# Patient Record
Sex: Female | Born: 1935 | Race: Black or African American | Hispanic: No | Smoking: Never smoker
Health system: Southern US, Community
[De-identification: ages and names within clinical notes are randomized; demographics above are authoritative.]

## PROBLEM LIST (undated history)

## (undated) DIAGNOSIS — Z801 Family history of malignant neoplasm of trachea, bronchus and lung: Secondary | ICD-10-CM

## (undated) DIAGNOSIS — C50919 Malignant neoplasm of unspecified site of unspecified female breast: Secondary | ICD-10-CM

## (undated) DIAGNOSIS — Z800 Family history of malignant neoplasm of digestive organs: Secondary | ICD-10-CM

## (undated) DIAGNOSIS — R499 Unspecified voice and resonance disorder: Secondary | ICD-10-CM

## (undated) DIAGNOSIS — I10 Essential (primary) hypertension: Secondary | ICD-10-CM

## (undated) DIAGNOSIS — I639 Cerebral infarction, unspecified: Secondary | ICD-10-CM

## (undated) DIAGNOSIS — C801 Malignant (primary) neoplasm, unspecified: Secondary | ICD-10-CM

## (undated) DIAGNOSIS — I739 Peripheral vascular disease, unspecified: Secondary | ICD-10-CM

## (undated) DIAGNOSIS — D649 Anemia, unspecified: Secondary | ICD-10-CM

## (undated) DIAGNOSIS — I341 Nonrheumatic mitral (valve) prolapse: Secondary | ICD-10-CM

## (undated) HISTORY — DX: Malignant (primary) neoplasm, unspecified: C80.1

## (undated) HISTORY — PX: MASTECTOMY: SHX3

## (undated) HISTORY — DX: Unspecified voice and resonance disorder: R49.9

## (undated) HISTORY — DX: Essential (primary) hypertension: I10

## (undated) HISTORY — DX: Anemia, unspecified: D64.9

## (undated) HISTORY — DX: Family history of malignant neoplasm of trachea, bronchus and lung: Z80.1

## (undated) HISTORY — PX: TONSILLECTOMY: SUR1361

## (undated) HISTORY — PX: ABDOMINAL HYSTERECTOMY: SHX81

## (undated) HISTORY — PX: EYE SURGERY: SHX253

## (undated) HISTORY — DX: Cerebral infarction, unspecified: I63.9

## (undated) HISTORY — PX: WRIST SURGERY: SHX841

## (undated) HISTORY — DX: Malignant neoplasm of unspecified site of unspecified female breast: C50.919

## (undated) HISTORY — DX: Family history of malignant neoplasm of digestive organs: Z80.0

---

## 1980-01-03 HISTORY — PX: LAPAROSCOPIC HYSTERECTOMY: SHX1926

## 1990-01-29 HISTORY — PX: BREAST SURGERY: SHX581

## 1997-09-11 ENCOUNTER — Ambulatory Visit (HOSPITAL_COMMUNITY): Admission: RE | Admit: 1997-09-11 | Discharge: 1997-09-11 | Payer: Self-pay | Admitting: Adolescent Medicine

## 1998-03-31 ENCOUNTER — Ambulatory Visit (HOSPITAL_COMMUNITY): Admission: RE | Admit: 1998-03-31 | Discharge: 1998-03-31 | Payer: Self-pay | Admitting: General Surgery

## 1998-03-31 ENCOUNTER — Encounter (HOSPITAL_BASED_OUTPATIENT_CLINIC_OR_DEPARTMENT_OTHER): Payer: Self-pay | Admitting: General Surgery

## 2000-03-28 ENCOUNTER — Ambulatory Visit (HOSPITAL_COMMUNITY): Admission: RE | Admit: 2000-03-28 | Discharge: 2000-03-28 | Payer: Self-pay | Admitting: Oncology

## 2000-03-28 ENCOUNTER — Encounter (HOSPITAL_COMMUNITY): Payer: Self-pay | Admitting: Oncology

## 2004-05-17 ENCOUNTER — Emergency Department: Payer: Self-pay | Admitting: Emergency Medicine

## 2004-11-29 ENCOUNTER — Ambulatory Visit: Payer: Self-pay

## 2004-12-20 ENCOUNTER — Ambulatory Visit: Payer: Self-pay | Admitting: Oncology

## 2004-12-29 ENCOUNTER — Ambulatory Visit (HOSPITAL_COMMUNITY): Admission: RE | Admit: 2004-12-29 | Discharge: 2004-12-29 | Payer: Self-pay | Admitting: Oncology

## 2004-12-29 ENCOUNTER — Encounter: Admission: RE | Admit: 2004-12-29 | Discharge: 2004-12-29 | Payer: Self-pay | Admitting: Oncology

## 2005-02-06 ENCOUNTER — Ambulatory Visit: Payer: Self-pay | Admitting: Oncology

## 2005-02-28 ENCOUNTER — Encounter: Admission: RE | Admit: 2005-02-28 | Discharge: 2005-02-28 | Payer: Self-pay | Admitting: General Surgery

## 2005-03-02 ENCOUNTER — Ambulatory Visit (HOSPITAL_BASED_OUTPATIENT_CLINIC_OR_DEPARTMENT_OTHER): Admission: RE | Admit: 2005-03-02 | Discharge: 2005-03-02 | Payer: Self-pay | Admitting: General Surgery

## 2005-04-12 ENCOUNTER — Ambulatory Visit: Payer: Self-pay | Admitting: Oncology

## 2005-05-03 ENCOUNTER — Encounter: Payer: Self-pay | Admitting: *Deleted

## 2005-06-10 ENCOUNTER — Emergency Department: Payer: Self-pay | Admitting: Unknown Physician Specialty

## 2005-06-26 ENCOUNTER — Ambulatory Visit: Payer: Self-pay | Admitting: Internal Medicine

## 2005-06-30 ENCOUNTER — Ambulatory Visit: Payer: Self-pay | Admitting: Internal Medicine

## 2005-07-28 ENCOUNTER — Encounter: Admission: RE | Admit: 2005-07-28 | Discharge: 2005-07-28 | Payer: Self-pay | Admitting: General Surgery

## 2005-08-07 ENCOUNTER — Encounter: Admission: RE | Admit: 2005-08-07 | Discharge: 2005-08-07 | Payer: Self-pay | Admitting: General Surgery

## 2005-08-14 ENCOUNTER — Encounter: Admission: RE | Admit: 2005-08-14 | Discharge: 2005-08-14 | Payer: Self-pay | Admitting: General Surgery

## 2005-08-21 ENCOUNTER — Encounter: Admission: RE | Admit: 2005-08-21 | Discharge: 2005-08-21 | Payer: Self-pay | Admitting: General Surgery

## 2005-09-12 ENCOUNTER — Ambulatory Visit (HOSPITAL_BASED_OUTPATIENT_CLINIC_OR_DEPARTMENT_OTHER): Admission: RE | Admit: 2005-09-12 | Discharge: 2005-09-12 | Payer: Self-pay | Admitting: General Surgery

## 2005-09-12 HISTORY — PX: BREAST SURGERY: SHX581

## 2006-09-26 ENCOUNTER — Ambulatory Visit: Payer: Self-pay | Admitting: General Surgery

## 2007-03-20 ENCOUNTER — Emergency Department: Payer: Self-pay | Admitting: Emergency Medicine

## 2007-07-12 ENCOUNTER — Ambulatory Visit: Payer: Self-pay | Admitting: Internal Medicine

## 2007-08-26 ENCOUNTER — Ambulatory Visit: Payer: Self-pay | Admitting: Internal Medicine

## 2007-11-10 ENCOUNTER — Emergency Department: Payer: Self-pay | Admitting: Emergency Medicine

## 2008-01-03 HISTORY — PX: COLONOSCOPY: SHX174

## 2008-06-26 ENCOUNTER — Emergency Department: Payer: Self-pay | Admitting: Emergency Medicine

## 2008-10-13 ENCOUNTER — Ambulatory Visit: Payer: Self-pay | Admitting: Internal Medicine

## 2008-10-15 ENCOUNTER — Ambulatory Visit: Payer: Self-pay | Admitting: Internal Medicine

## 2009-04-15 DIAGNOSIS — I639 Cerebral infarction, unspecified: Secondary | ICD-10-CM

## 2009-04-15 HISTORY — DX: Cerebral infarction, unspecified: I63.9

## 2009-04-16 ENCOUNTER — Inpatient Hospital Stay: Payer: Self-pay | Admitting: Internal Medicine

## 2010-09-29 ENCOUNTER — Other Ambulatory Visit (INDEPENDENT_AMBULATORY_CARE_PROVIDER_SITE_OTHER): Payer: Self-pay | Admitting: General Surgery

## 2010-09-29 ENCOUNTER — Encounter (INDEPENDENT_AMBULATORY_CARE_PROVIDER_SITE_OTHER): Payer: Self-pay | Admitting: General Surgery

## 2010-09-29 ENCOUNTER — Ambulatory Visit (INDEPENDENT_AMBULATORY_CARE_PROVIDER_SITE_OTHER): Payer: PRIVATE HEALTH INSURANCE | Admitting: General Surgery

## 2010-09-29 ENCOUNTER — Ambulatory Visit
Admission: RE | Admit: 2010-09-29 | Discharge: 2010-09-29 | Disposition: A | Discharge status: Home / Self Care | Payer: 59 | Source: Ambulatory Visit | Attending: General Surgery | Admitting: General Surgery

## 2010-09-29 VITALS — BP 162/94 | HR 62 | Temp 97.0°F | Resp 14 | Ht 68.5 in | Wt 172.8 lb

## 2010-09-29 DIAGNOSIS — Z853 Personal history of malignant neoplasm of breast: Secondary | ICD-10-CM

## 2010-09-29 DIAGNOSIS — R0789 Other chest pain: Secondary | ICD-10-CM

## 2010-09-29 DIAGNOSIS — R071 Chest pain on breathing: Secondary | ICD-10-CM

## 2010-09-29 NOTE — Progress Notes (Signed)
Chief Complaint  Patient presents with  . Pain    Eval left breast pain     HPI Rebekah Lowery is a 77 y.o. female.   This patient was referred to me by Dr. Dario Guardian for evaluation of left chest wall pain and for a breast checkup.  The patient has a significant history of a left mastectomy by Dr. Leola Brazil in 1992 for  metachronous breast carcinoma. She also has a history of a right partial mastectomy by Dr. Francina Ames in 2007 for ductal carcinoma in situ, 5 mm, subareolar.. She has a remote history of a right breast biopsy for benign disease. Last mammogram was at [facility] in 2008.  She states that she had a stroke in April 2011 and has a little bit of memory problems and otherwise is doing well. She has diet-controlled diabetes. She has hypertension.  She recently saw her primary care physician and had some irritation under her right arm and was placed on antibiotics. She feels that she has a tiny cyst under the right arm. She has no complaints about her right breast. She has no complains about her left mastectomy wound but she does note some intermittent pain under the incision. It is not hurting female. She denies trauma.  She has not had any recent imaging studies. She apparently does not get regular mammograms.   HPI  Past Medical History  Diagnosis Date  . Anemia   . Stroke 04/15/09  . Diabetes mellitus   . Hypertension   . Change in voice   . Cancer     left breast  . Family history of pancreatic cancer   . Family history of lung cancer     Past Surgical History  Procedure Date  . Breast surgery 01/29/1990    mastectomy of left  . Breast surgery 09/12/2005    pasrtial mastectomy of right    Family History  Problem Relation Age of Onset  . Cancer Sister     breast  . Cancer Maternal Aunt     breast    Social History History  Substance Use Topics  . Smoking status: Never Smoker   . Smokeless tobacco: Not on file  . Alcohol Use: No     Allergies  Allergen Reactions  . Aspirin Swelling and Other (See Comments)    Swelling of lips, caused pain also.  . Citric Acid Swelling and Rash    Swelling all over the body.  . Codeine Itching, Swelling and Rash    Swelling was all over the body.  Luster Landsberg Juice Swelling and Rash    Allergic to lemons in general.  . Monosodium Glutamate Other (See Comments)    Causes spike in bp, nausea, constipation, gi upset.  . Peanut-Containing Drug Products Nausea Only    Current Outpatient Prescriptions  Medication Sig Dispense Refill  . Propranolol HCl (INDERAL PO) Take by mouth 2 (two) times daily. Patient takes the generic version of this medication. Unaware of the name and dosage at this time. Ask patient to provide information at next appt.         Review of Systems Review of Systems  Constitutional: Negative.   Eyes: Negative.   Respiratory: Negative.   Cardiovascular: Negative.   Gastrointestinal: Negative.   Genitourinary: Negative.   Musculoskeletal: Negative.   Neurological: Negative.        Memory problems. No motor or sensory deficits by history.  Hematological: Negative.   Psychiatric/Behavioral: Negative.  Blood pressure 162/94, pulse 62, temperature 97 F (36.1 C), temperature source Temporal, resp. rate 14, height 5' 8.5" (1.74 m), weight 172 lb 12.8 oz (78.382 kg).  Physical Exam Physical Exam  Constitutional: She is oriented to person, place, and time. She appears well-developed and well-nourished. No distress.  HENT:  Head: Normocephalic and atraumatic.  Nose: Nose normal.  Mouth/Throat: No oropharyngeal exudate.  Eyes: Conjunctivae are normal. Pupils are equal, round, and reactive to light. Right eye exhibits no discharge. Left eye exhibits no discharge. No scleral icterus.  Neck: Normal range of motion. Neck supple. No JVD present. No tracheal deviation present. No thyromegaly present.  Cardiovascular: Normal rate, regular rhythm, normal heart  sounds and intact distal pulses.  Exam reveals no gallop.   No murmur heard. Pulmonary/Chest: Effort normal and breath sounds normal. No respiratory distress. She has no wheezes. She has no rales. She exhibits no tenderness.    Abdominal: Bowel sounds are normal. She exhibits no distension and no mass. There is no tenderness. There is no rebound and no guarding.  Musculoskeletal: Normal range of motion. She exhibits no edema and no tenderness.  Lymphadenopathy:    She has no cervical adenopathy.  Neurological: She is alert and oriented to person, place, and time. She exhibits normal muscle tone. Coordination normal.  Skin: Skin is warm and dry. No rash noted. She is not diaphoretic. No erythema. No pallor.  Psychiatric: She has a normal mood and affect. Her behavior is normal. Judgment and thought content normal.    Data Reviewed I have reviewed all of her records in the old chart. There is no new data or information.  Assessment    Intermittent left chest wall pain. The mild intermittent nature of this suggests benign etiology such as costochondritis.  Status post left modified radical mastectomy 20 years ago for breast cancer. No evidence of local recurrence by physical exam.  Status post right partial mastectomy for ductal carcinoma in situ, 2007, no evidence of recurrent cancer by physical exam.  History of cerebrovascular accident April 2011.  Hypertension  Borderline diabetes.      Plan    We will obtain a diagnostic right breast mammogram, chest x-ray, and left rib detail.  She'll return to see me in one month for followup visit.  She was advised to get annual mammography and annual breast exam.       [name] M 09/29/2010, 9:58 AM

## 2010-09-29 NOTE — Patient Instructions (Signed)
Your physical exam today is normal. There is no evidence of cancer. You will be scheduled for a right breast mammogram, chest x-ray, and left rib x-rays to evaluate the left anterior chest wall pain. You will return to see me in one month.

## 2010-10-10 ENCOUNTER — Ambulatory Visit
Admission: RE | Admit: 2010-10-10 | Discharge: 2010-10-10 | Disposition: A | Discharge status: Home / Self Care | Payer: 59 | Source: Ambulatory Visit | Attending: Surgery | Admitting: Surgery

## 2010-10-10 DIAGNOSIS — Z853 Personal history of malignant neoplasm of breast: Secondary | ICD-10-CM

## 2010-10-31 ENCOUNTER — Encounter (INDEPENDENT_AMBULATORY_CARE_PROVIDER_SITE_OTHER): Payer: Self-pay | Admitting: General Surgery

## 2010-10-31 ENCOUNTER — Ambulatory Visit (INDEPENDENT_AMBULATORY_CARE_PROVIDER_SITE_OTHER): Payer: PRIVATE HEALTH INSURANCE | Admitting: General Surgery

## 2010-10-31 VITALS — BP 160/78 | HR 68 | Temp 97.2°F | Resp 18 | Wt 174.0 lb

## 2010-10-31 DIAGNOSIS — Z853 Personal history of malignant neoplasm of breast: Secondary | ICD-10-CM

## 2010-10-31 NOTE — Patient Instructions (Signed)
Your mammograms of the right breast are normal. Your chest x-ray is normal. The left sided rib pain has resolved. I do not find any evidence of recurrent breast cancer. Your advised to get a breast exam and a mammogram once a year. This will be performed by your primary care physician. Return to see me if any new problems arise.

## 2010-10-31 NOTE — Progress Notes (Signed)
Subjective:     Patient ID: Rebekah Lowery, female   DOB: November 09, 1933, 77 y.o.   MRN: [id number]  HPI This 77 year old African American female returns for followup evaluation of her left chest wall pain and history of breast cancer.  Her chest x-ray is normal, and the mammograms of her right breast are normal. She states that the left rib pain in the anterior inferior costal margin has resolved.  Review of Systems  Constitutional: Negative for fever, chills and unexpected weight change.  HENT: Negative for hearing loss, congestion, sore throat, trouble swallowing and voice change.   Eyes: Negative for visual disturbance.  Respiratory: Negative for cough and wheezing.   Cardiovascular: Negative for chest pain, palpitations and leg swelling.  Gastrointestinal: Negative for nausea, vomiting, abdominal pain, diarrhea, constipation, blood in stool, abdominal distention and anal bleeding.  Genitourinary: Negative for hematuria, vaginal bleeding and difficulty urinating.  Musculoskeletal: Negative for arthralgias.  Skin: Negative for rash and wound.  Neurological: Negative for seizures, syncope and headaches.  Hematological: Negative for adenopathy. Does not bruise/bleed easily.  Psychiatric/Behavioral: Negative for confusion.       Objective:   Physical Exam  Neck: Normal range of motion. Neck supple. No JVD present. No tracheal deviation present. No thyromegaly present.  Cardiovascular: Normal rate, regular rhythm and normal heart sounds.   Pulmonary/Chest: Effort normal and breath sounds normal. No respiratory distress. She has no wheezes. She has no rales. She exhibits no tenderness.       No rib or costal cartiledge tenderness. Right breast scars healed, without mass or adenopathy. Left mastectomy scar soft, no nodules, ulceration or adenopathy.  Lymphadenopathy:    She has no cervical adenopathy.  Skin: Skin is warm and dry. No rash noted. No erythema. No pallor.  Psychiatric: She has  a normal mood and affect. Her behavior is normal. Judgment and thought content normal.       Assessment:     Left chest wall pain, costal cartilage area.   Since this has resolved and the chest x-ray and mammograms are normal, I think that this is a benign self-limited process such as costochondritis.  History of left modified radical mastectomy, 1992 for stage II breast cancer, followed by adjuvant chemotherapy and endocrine therapy. No known recurrence to date.  History right partial mastectomy 2007 for ductal carcinoma in situ. No additional therapy was offered. No evidence of recurrence.  History CVA in 2011.  Anemia  Diabetes mellitus  Hypertension.     Plan:     The patient was advised of her imaging findings and my impressions.  She is advised to get a right breast mammogram annually, and to also get a breast exam by her primary care physician annually.  Return to see me.

## 2010-12-29 ENCOUNTER — Encounter (INDEPENDENT_AMBULATORY_CARE_PROVIDER_SITE_OTHER): Payer: PRIVATE HEALTH INSURANCE | Admitting: Surgery

## 2011-01-03 HISTORY — PX: BREAST SURGERY: SHX581

## 2011-06-27 ENCOUNTER — Emergency Department: Payer: Self-pay | Admitting: Emergency Medicine

## 2011-10-02 ENCOUNTER — Ambulatory Visit: Payer: Self-pay | Admitting: Obstetrics and Gynecology

## 2011-10-17 ENCOUNTER — Ambulatory Visit: Payer: Self-pay | Admitting: Obstetrics and Gynecology

## 2011-11-02 ENCOUNTER — Ambulatory Visit: Payer: Self-pay | Admitting: General Surgery

## 2011-11-06 ENCOUNTER — Other Ambulatory Visit: Payer: Self-pay | Admitting: General Surgery

## 2011-11-06 LAB — CBC WITH DIFFERENTIAL/PLATELET
Basophil #: 0 10*3/uL (ref 0.0–0.1)
Basophil %: 0.7 %
Eosinophil #: 0.1 10*3/uL (ref 0.0–0.7)
Eosinophil %: 1.7 %
HCT: 36.9 % (ref 35.0–47.0)
HGB: 12 g/dL (ref 12.0–16.0)
Lymphocyte #: 2.2 10*3/uL (ref 1.0–3.6)
Lymphocyte %: 31 %
MCH: 26.1 pg (ref 26.0–34.0)
MCHC: 32.6 g/dL (ref 32.0–36.0)
MCV: 80 fL (ref 80–100)
Monocyte #: 0.5 x10 3/mm (ref 0.2–0.9)
Monocyte %: 6.5 %
Neutrophil #: 4.2 10*3/uL (ref 1.4–6.5)
Neutrophil %: 60.1 %
Platelet: 260 10*3/uL (ref 150–440)
RBC: 4.61 10*6/uL (ref 3.80–5.20)
RDW: 13.7 % (ref 11.5–14.5)
WBC: 7 10*3/uL (ref 3.6–11.0)

## 2011-11-06 LAB — COMPREHENSIVE METABOLIC PANEL
Albumin: 3.5 g/dL (ref 3.4–5.0)
Alkaline Phosphatase: 91 U/L (ref 50–136)
Anion Gap: 4 — ABNORMAL LOW (ref 7–16)
BUN: 14 mg/dL (ref 7–18)
Bilirubin,Total: 0.3 mg/dL (ref 0.2–1.0)
Calcium, Total: 8.7 mg/dL (ref 8.5–10.1)
Chloride: 106 mmol/L (ref 98–107)
Co2: 30 mmol/L (ref 21–32)
Creatinine: 1.04 mg/dL (ref 0.60–1.30)
EGFR (African American): >60
EGFR (Non-African Amer.): 52 — ABNORMAL LOW
Glucose: 142 mg/dL — ABNORMAL HIGH (ref 65–99)
Osmolality: 282 (ref 275–301)
Potassium: 4 mmol/L (ref 3.5–5.1)
SGOT(AST): 21 U/L (ref 15–37)
SGPT (ALT): 23 U/L (ref 12–78)
Sodium: 140 mmol/L (ref 136–145)
Total Protein: 8.2 g/dL (ref 6.4–8.2)

## 2011-11-07 LAB — CANCER ANTIGEN 27.29: CA 27.29: 13.4 U/mL (ref 0.0–38.6)

## 2011-11-07 LAB — CEA: CEA: 6.3 ng/mL — ABNORMAL HIGH (ref 0.0–4.7)

## 2011-11-14 ENCOUNTER — Ambulatory Visit: Payer: Self-pay | Admitting: General Surgery

## 2011-11-17 LAB — PATHOLOGY REPORT

## 2011-11-21 ENCOUNTER — Ambulatory Visit: Payer: Self-pay | Admitting: General Surgery

## 2011-11-24 LAB — PATHOLOGY REPORT

## 2011-12-11 DIAGNOSIS — C50919 Malignant neoplasm of unspecified site of unspecified female breast: Secondary | ICD-10-CM

## 2011-12-11 HISTORY — DX: Malignant neoplasm of unspecified site of unspecified female breast: C50.919

## 2011-12-16 ENCOUNTER — Encounter: Payer: Self-pay | Admitting: *Deleted

## 2012-01-24 ENCOUNTER — Ambulatory Visit: Payer: Self-pay | Admitting: General Surgery

## 2012-02-19 ENCOUNTER — Ambulatory Visit: Payer: Self-pay | Admitting: Oncology

## 2012-03-02 ENCOUNTER — Ambulatory Visit: Payer: Self-pay | Admitting: Oncology

## 2012-05-07 ENCOUNTER — Ambulatory Visit: Payer: Self-pay | Admitting: Ophthalmology

## 2012-05-07 DIAGNOSIS — I10 Essential (primary) hypertension: Secondary | ICD-10-CM

## 2012-05-07 LAB — CBC WITH DIFFERENTIAL/PLATELET
Basophil #: 0.1 10*3/uL (ref 0.0–0.1)
Basophil %: 1.1 %
Eosinophil #: 0.1 10*3/uL (ref 0.0–0.7)
Eosinophil %: 1.8 %
HCT: 33.6 % — ABNORMAL LOW (ref 35.0–47.0)
HGB: 11.1 g/dL — ABNORMAL LOW (ref 12.0–16.0)
Lymphocyte #: 2.1 10*3/uL (ref 1.0–3.6)
Lymphocyte %: 33.3 %
MCH: 25.8 pg — ABNORMAL LOW (ref 26.0–34.0)
MCHC: 33.1 g/dL (ref 32.0–36.0)
MCV: 78 fL — ABNORMAL LOW (ref 80–100)
Monocyte #: 0.5 x10 3/mm (ref 0.2–0.9)
Monocyte %: 7.7 %
Neutrophil #: 3.5 10*3/uL (ref 1.4–6.5)
Neutrophil %: 56.1 %
Platelet: 231 10*3/uL (ref 150–440)
RBC: 4.32 10*6/uL (ref 3.80–5.20)
RDW: 13.6 % (ref 11.5–14.5)
WBC: 6.3 10*3/uL (ref 3.6–11.0)

## 2012-05-07 LAB — POTASSIUM: Potassium: 4.2 mmol/L (ref 3.5–5.1)

## 2012-05-20 ENCOUNTER — Ambulatory Visit: Payer: Self-pay | Admitting: Ophthalmology

## 2012-06-03 ENCOUNTER — Ambulatory Visit: Payer: Self-pay | Admitting: Oncology

## 2012-08-12 ENCOUNTER — Emergency Department: Payer: Self-pay | Admitting: Emergency Medicine

## 2012-08-12 LAB — COMPREHENSIVE METABOLIC PANEL
Albumin: 4 g/dL (ref 3.4–5.0)
Alkaline Phosphatase: 90 U/L (ref 50–136)
Anion Gap: 5 — ABNORMAL LOW (ref 7–16)
BUN: 19 mg/dL — ABNORMAL HIGH (ref 7–18)
Bilirubin,Total: 0.2 mg/dL (ref 0.2–1.0)
Calcium, Total: 9.4 mg/dL (ref 8.5–10.1)
Chloride: 102 mmol/L (ref 98–107)
Co2: 30 mmol/L (ref 21–32)
Creatinine: 1.19 mg/dL (ref 0.60–1.30)
EGFR (African American): 51 — ABNORMAL LOW
EGFR (Non-African Amer.): 44 — ABNORMAL LOW
Glucose: 161 mg/dL — ABNORMAL HIGH (ref 65–99)
Osmolality: 280 (ref 275–301)
Potassium: 4 mmol/L (ref 3.5–5.1)
SGOT(AST): 19 U/L (ref 15–37)
SGPT (ALT): 19 U/L (ref 12–78)
Sodium: 137 mmol/L (ref 136–145)
Total Protein: 8.6 g/dL — ABNORMAL HIGH (ref 6.4–8.2)

## 2012-08-12 LAB — CBC
HCT: 37.9 % (ref 35.0–47.0)
HGB: 12.9 g/dL (ref 12.0–16.0)
MCH: 27 pg (ref 26.0–34.0)
MCHC: 33.9 g/dL (ref 32.0–36.0)
MCV: 80 fL (ref 80–100)
Platelet: 247 10*3/uL (ref 150–440)
RBC: 4.76 10*6/uL (ref 3.80–5.20)
RDW: 13.7 % (ref 11.5–14.5)
WBC: 8.2 10*3/uL (ref 3.6–11.0)

## 2013-01-13 ENCOUNTER — Emergency Department: Payer: Self-pay | Admitting: Emergency Medicine

## 2013-01-16 ENCOUNTER — Ambulatory Visit: Payer: Self-pay | Admitting: Physician Assistant

## 2013-03-28 ENCOUNTER — Ambulatory Visit: Payer: Self-pay | Admitting: Podiatry

## 2013-03-28 ENCOUNTER — Ambulatory Visit (INDEPENDENT_AMBULATORY_CARE_PROVIDER_SITE_OTHER): Payer: Medicare Other

## 2013-03-28 ENCOUNTER — Ambulatory Visit (INDEPENDENT_AMBULATORY_CARE_PROVIDER_SITE_OTHER): Payer: Medicare Other | Admitting: Podiatry

## 2013-03-28 VITALS — BP 155/97 | HR 67 | Resp 16 | Ht 68.0 in | Wt 170.0 lb

## 2013-03-28 DIAGNOSIS — M79672 Pain in left foot: Principal | ICD-10-CM

## 2013-03-28 DIAGNOSIS — M79609 Pain in unspecified limb: Secondary | ICD-10-CM

## 2013-03-28 DIAGNOSIS — M79671 Pain in right foot: Secondary | ICD-10-CM

## 2013-03-28 DIAGNOSIS — M7750 Other enthesopathy of unspecified foot: Secondary | ICD-10-CM

## 2013-03-28 NOTE — Progress Notes (Signed)
   Subjective:    Patient ID: Rebekah Lowery, female    DOB: 1933-07-30, 80 y.o.   MRN: [id number]  HPI Comments: HAVING CRAMPING IN BOTH MY FEET. DIABETIC FOR 3 YEARS . 8.1 WAS LAST A1C   Foot Pain      Review of Systems  HENT:       SINUS PROBLEM RINGING IN EARS  Endocrine:       DIABETES  Musculoskeletal: Positive for back pain.  Allergic/Immunologic: Positive for environmental allergies and food allergies.  Hematological: Bruises/bleeds easily.  All other systems reviewed and are negative.       Objective:   Physical Exam        Assessment & Plan:

## 2013-03-28 NOTE — Progress Notes (Signed)
Subjective:     Patient ID: Rebekah Lowery, female   DOB: 19-Aug-1933, 80 y.o.   MRN: [id number]  Foot Pain   patient states that she is getting cramps in her feet and she is a diabetic who hasn't A1c of 8 and would like to have this checked   Review of Systems  All other systems reviewed and are negative.       Objective:   Physical Exam  Nursing note and vitals reviewed. Constitutional: She is oriented to person, place, and time.  Cardiovascular: Intact distal pulses.   Musculoskeletal: Normal range of motion.  Neurological: She is oriented to person, place, and time.  Skin: Skin is warm.   neurovascular status is intact with normal range of motion subtalar midtarsal joint and no indications of loss of vibratory or sharp dull testing. Mild discomfort in the arches with some lowering of the arch and no other structural abnormalities noted    Assessment:     May be due to potassium deficit or just generalized condition    Plan:     H&P and x-rays reviewed. Gave instructions on chronic water 2 ounces at nighttime and Epsom salts soaks. Reappoint if symptoms persist

## 2013-04-09 ENCOUNTER — Encounter: Payer: Self-pay | Admitting: Podiatry

## 2013-12-08 ENCOUNTER — Other Ambulatory Visit: Payer: Self-pay | Admitting: General Surgery

## 2013-12-09 ENCOUNTER — Telehealth: Payer: Self-pay | Admitting: *Deleted

## 2013-12-09 NOTE — Telephone Encounter (Signed)
The patient has not been seen since Feb 2014 and not in recalls.

## 2013-12-09 NOTE — Telephone Encounter (Signed)
Need to ask pt about her Tamoxifen refill request. She has not been seen since Feb 2014.

## 2013-12-12 ENCOUNTER — Encounter: Payer: Self-pay | Admitting: General Surgery

## 2013-12-30 ENCOUNTER — Encounter: Payer: Self-pay | Admitting: General Surgery

## 2013-12-30 ENCOUNTER — Ambulatory Visit (INDEPENDENT_AMBULATORY_CARE_PROVIDER_SITE_OTHER): Payer: Medicare Other | Admitting: General Surgery

## 2013-12-30 VITALS — BP 122/68 | HR 78 | Resp 14 | Ht 68.0 in | Wt 177.0 lb

## 2013-12-30 DIAGNOSIS — C50919 Malignant neoplasm of unspecified site of unspecified female breast: Secondary | ICD-10-CM

## 2013-12-30 MED ORDER — TAMOXIFEN CITRATE 20 MG PO TABS: 20.0000 mg | ORAL_TABLET | Freq: Every day | ORAL | Status: DC

## 2013-12-30 NOTE — Progress Notes (Signed)
Patient ID: Rebekah Lowery, female   DOB: [birth year], 80 y.o.   MRN: [id number]  Chief Complaint  Patient presents with  . Follow-up    breast cancer    HPI Rebekah Lowery is a 80 y.o. female here today to discuss a refill on her tamoxifen. The patient had been seen by medical oncology, Dr. Grayland Ormond, but then was lost ongoing follow-up in both offices. HPI  Past Medical History  Diagnosis Date  . Anemia   . Stroke 04/15/09  . Diabetes mellitus   . Hypertension   . Change in voice   . Cancer     left breast  . Family history of pancreatic cancer   . Family history of lung cancer   . Malignant neoplasm of breast (female), unspecified site 12/11/2011    Past Surgical History  Procedure Laterality Date  . Laparoscopic hysterectomy  1982  . Breast surgery  01/29/1990    mastectomy of left  . Breast surgery  09/12/2005    pasrtial mastectomy of right  . Breast surgery  2013    Right Mastectomy  . Colon surgery  2010    Family History  Problem Relation Age of Onset  . Cancer Sister     breast  . Cancer Maternal Aunt     breast    Social History History  Substance Use Topics  . Smoking status: Never Smoker   . Smokeless tobacco: Never Used  . Alcohol Use: No    Allergies  Allergen Reactions  . Aspirin Swelling and Other (See Comments)    Swelling of lips, caused pain also.  . Citric Acid Swelling and Rash    Swelling all over the body.  . Codeine Itching, Swelling and Rash    Swelling was all over the body.  Koren Bound Juice Swelling and Rash    Allergic to lemons in general.  . Monosodium Glutamate Other (See Comments)    Causes spike in bp, nausea, constipation, gi upset.  . Peanut-Containing Drug Products Nausea Only    Current Outpatient Prescriptions  Medication Sig Dispense Refill  . glipiZIDE (GLUCOTROL XL) 5 MG 24 hr tablet Take 5 mg by mouth daily with breakfast.    . lisinopril-hydrochlorothiazide (PRINZIDE,ZESTORETIC) 20-25 MG per tablet Take 1  tablet by mouth daily.    . tamoxifen (NOLVADEX) 20 MG tablet Take 1 tablet (20 mg total) by mouth daily. 90 tablet 4   No current facility-administered medications for this visit.    Review of Systems Review of Systems  Constitutional: Negative.   Respiratory: Negative.   Cardiovascular: Negative.     Blood pressure 122/68, pulse 78, resp. rate 14, height 5\' 8"  (1.727 m), weight 177 lb (80.287 kg).  Physical Exam Physical Exam  Constitutional: She is oriented to person, place, and time. She appears well-developed and well-nourished.  Eyes: Conjunctivae are normal. No scleral icterus.  Neck: Neck supple.  Cardiovascular: Normal rate, regular rhythm and normal heart sounds.   Pulmonary/Chest: Effort normal and breath sounds normal.  Chest well is well healed . (Status post bilateral mastectomy) No upper extremity swelling is identified.   Good shoulder range of motion.  Abdominal: Soft. Bowel sounds are normal. There is no tenderness.  Lymphadenopathy:    She has no cervical adenopathy.    She has no axillary adenopathy.  Neurological: She is alert and oriented to person, place, and time.  Skin: Skin is warm and dry.    Data Reviewed Review of the medical oncology notes  from 2013 was completed. Dr. Grayland Ormond had recommended a change to an aromatase inhibitor. The patient declined this as she had tolerated tamoxifen well. This will be continued for a five-year course..  Assessment    No evidence of local regional recurrence.    Plan     Patient to return in one year. The patient will continue tamoxifen 20 mg daily.  PCP:  Karie Soda 01/02/2014, 7:13 AM

## 2013-12-30 NOTE — Patient Instructions (Signed)
Patient to return in one year. 

## 2014-01-02 DIAGNOSIS — C50919 Malignant neoplasm of unspecified site of unspecified female breast: Secondary | ICD-10-CM | POA: Insufficient documentation

## 2014-03-16 ENCOUNTER — Encounter: Payer: Self-pay | Admitting: General Surgery

## 2014-03-24 ENCOUNTER — Ambulatory Visit (INDEPENDENT_AMBULATORY_CARE_PROVIDER_SITE_OTHER): Payer: Medicare Other | Admitting: Podiatry

## 2014-03-24 ENCOUNTER — Ambulatory Visit: Payer: Self-pay | Admitting: Podiatry

## 2014-03-24 DIAGNOSIS — M2040 Other hammer toe(s) (acquired), unspecified foot: Secondary | ICD-10-CM | POA: Diagnosis not present

## 2014-03-24 DIAGNOSIS — E1140 Type 2 diabetes mellitus with diabetic neuropathy, unspecified: Secondary | ICD-10-CM

## 2014-03-24 DIAGNOSIS — E1149 Type 2 diabetes mellitus with other diabetic neurological complication: Secondary | ICD-10-CM

## 2014-03-24 DIAGNOSIS — M2142 Flat foot [pes planus] (acquired), left foot: Secondary | ICD-10-CM | POA: Diagnosis not present

## 2014-03-24 DIAGNOSIS — M2141 Flat foot [pes planus] (acquired), right foot: Secondary | ICD-10-CM

## 2014-03-25 NOTE — Progress Notes (Signed)
Patient ID: Rebekah Lowery, female   DOB: 24-Dec-1933, 81 y.o.   MRN: [id number]  Subjective: 81 year old female presents the office today requesting diabetic shoes. She has no other complaints at this time. She is no acute changes since last appointment and no new complaints at this time. She is diabetic and she states her last blood sugar was 112. She denies any history of ulceration although she does have tingling and numbness for which she is taking gabapentin. Denies any claudication symptoms.  Objective: AAO 3, NAD DP/PT pulses palpable, CRT less than 3 seconds Protective sensation decreased with Simms Weinstein monofilament, decreased vibratory sensation, Achilles tendon reflex intact. There is a decrease in medial arch height upon weightbearing with hammertoe contractures present. There is no areas of pinpoint bony tenderness or pain with vibratory sensation of bilateral lower extremity. No overlying edema, erythema, increase in warmth. No open lesions at this time. No pain with calf compression, swelling, warmth, erythema.  Assessment: 81 year old female requesting diabetic shoes   Plan: -At complete a paperwork for precertification for diabetic shoes. Once they are approved we'll call the patient. -Discussed daily foot inspection. -Follow-up once shoe approval or sooner if any palms are to arise. In the meantime occur swelling office with any questions, concerns, change in symptoms.

## 2014-04-16 ENCOUNTER — Ambulatory Visit (INDEPENDENT_AMBULATORY_CARE_PROVIDER_SITE_OTHER): Payer: Medicare Other | Admitting: Podiatry

## 2014-04-16 ENCOUNTER — Encounter: Payer: Self-pay | Admitting: Podiatry

## 2014-04-16 DIAGNOSIS — E1140 Type 2 diabetes mellitus with diabetic neuropathy, unspecified: Secondary | ICD-10-CM

## 2014-04-16 DIAGNOSIS — E1149 Type 2 diabetes mellitus with other diabetic neurological complication: Secondary | ICD-10-CM

## 2014-04-16 NOTE — Progress Notes (Signed)
Pt scanned for diabetic shoes and inserts, follow up when they come in  No new complaints at this time.

## 2014-04-19 ENCOUNTER — Emergency Department
Admit: 2014-04-19 | Disposition: A | Discharge status: Home / Self Care | Payer: Self-pay | Admitting: Emergency Medicine

## 2014-04-20 LAB — CBC WITH DIFFERENTIAL/PLATELET
Basophil #: 0.1 10*3/uL (ref 0.0–0.1)
Basophil %: 0.7 %
Eosinophil #: 0.2 10*3/uL (ref 0.0–0.7)
Eosinophil %: 2.1 %
HCT: 35.9 % (ref 35.0–47.0)
HGB: 11.5 g/dL — ABNORMAL LOW (ref 12.0–16.0)
Lymphocyte #: 2.9 10*3/uL (ref 1.0–3.6)
Lymphocyte %: 30.5 %
MCH: 25.7 pg — ABNORMAL LOW (ref 26.0–34.0)
MCHC: 32.2 g/dL (ref 32.0–36.0)
MCV: 80 fL (ref 80–100)
Monocyte #: 0.8 x10 3/mm (ref 0.2–0.9)
Monocyte %: 8.2 %
Neutrophil #: 5.5 10*3/uL (ref 1.4–6.5)
Neutrophil %: 58.5 %
Platelet: 229 10*3/uL (ref 150–440)
RBC: 4.49 10*6/uL (ref 3.80–5.20)
RDW: 13.6 % (ref 11.5–14.5)
WBC: 9.5 10*3/uL (ref 3.6–11.0)

## 2014-04-20 LAB — URINALYSIS, COMPLETE
Bilirubin,UR: NEGATIVE
Blood: NEGATIVE
Glucose,UR: NEGATIVE mg/dL (ref 0–75)
Ketone: NEGATIVE
Leukocyte Esterase: NEGATIVE
Nitrite: NEGATIVE
Ph: 5 (ref 4.5–8.0)
Protein: NEGATIVE
Specific Gravity: 1.021 (ref 1.003–1.030)

## 2014-04-20 LAB — COMPREHENSIVE METABOLIC PANEL
Albumin: 4 g/dL
Alkaline Phosphatase: 48 U/L
Anion Gap: 5 — ABNORMAL LOW (ref 7–16)
BUN: 19 mg/dL
Bilirubin,Total: 0.3 mg/dL
Calcium, Total: 8.7 mg/dL — ABNORMAL LOW
Chloride: 106 mmol/L
Co2: 28 mmol/L
Creatinine: 1.07 mg/dL — ABNORMAL HIGH
EGFR (African American): 57 — ABNORMAL LOW
EGFR (Non-African Amer.): 49 — ABNORMAL LOW
Glucose: 146 mg/dL — ABNORMAL HIGH
Potassium: 4 mmol/L
SGOT(AST): 17 U/L
SGPT (ALT): 14 U/L
Sodium: 139 mmol/L
Total Protein: 8 g/dL

## 2014-04-20 LAB — TROPONIN I: Troponin-I: <0.03 ng/mL

## 2014-04-21 NOTE — Op Note (Signed)
PATIENT NAME:  Rebekah Lowery, Rebekah Lowery MR#:  [id number] DATE OF BIRTH:  [birth year]  DATE OF PROCEDURE:  11/21/2011  PREOPERATIVE DIAGNOSIS: Right breast cancer.   POSTOPERATIVE DIAGNOSIS: Right breast cancer.   OPERATIVE PROCEDURE: Right simple mastectomy with sentinel node biopsy.   OPERATING SURGEON: Robert Bellow, MD   ANESTHESIA: General by LMA under Dr. Jacinta Shoe BLOOD LOSS: Less than 50 mL.  FLUID REPLACEMENT: Crystalloid.   CLINICAL NOTE: This 81 year old woman recently had a mammogram showing new change. Biopsy showed evidence of infiltrating mammary cancer. She desired mastectomy. She underwent injection with technetium sulfur colloid the morning of the procedure. She is brought to the operating room for planned mastectomy.   OPERATIVE NOTE: With the patient under adequate general anesthesia, the area of an accessory nipple (the original nipple previously had been excised) was injected with a total of 6 mL of methylene blue diluted 1:2 with normal saline. Scanning through the axilla with the gamma finder showed increased uptake in the lower aspect of the axilla. A standard right mastectomy was undertaken making use of elliptical skin flaps. The borders of dissection were the rectus muscle inferiorly, serratus muscle laterally, sternum medially, and clavicle superiorly. The breast and the underlying fascia of the pectoralis muscle were taken in the specimen. Hemostasis was with electrocautery and 3-0 Vicryl ties. The perforating intercostal vessels were controlled with 3-0 Vicryl ties. The breast was sent fresh to pathology. A single non-blue but hot sentinel node was identified in the right axilla. This was reported as no evidence of macrometastasis by Quay Burow, MD. The area was irrigated with water. A Blake drain was brought out through the inferior aspect of the medial flap. This was anchored in place with 3-0 nylon. The skin flaps were then approximated with a running 2-0  Vicryl deep dermal suture. Benzoin and Steri-Strips followed by Telfa, fluffed gauze, Kerlix, and an Ace wrap was applied.   The patient tolerated the procedure well and was taken to the recovery room in stable condition.   ____________________________ Robert Bellow, MD jwb:drc D: 11/21/2011 21:08:02 ET T: 11/22/2011 10:32:17 ET JOB#: [id number]  cc: Robert Bellow, MD, <Dictator> Stoney Bang. Georgianne Fick, MD Isla Pence, MD  Camilla Skeen Amedeo Kinsman MD ELECTRONICALLY SIGNED 11/22/2011 10:52

## 2014-04-24 NOTE — Op Note (Signed)
PATIENT NAME:  Rebekah Lowery, Rebekah Lowery MR#:  [id number] DATE OF BIRTH:  Oct 12, 1933  DATE OF PROCEDURE:  05/20/2012  PREOPERATIVE DIAGNOSIS:  Cataract, left eye.    POSTOPERATIVE DIAGNOSIS:  Cataract, left eye.  PROCEDURE PERFORMED:  Extracapsular cataract extraction using phacoemulsification with placement of an Alcon [id number], 20.5-diopter posterior chamber lens, serial [id number].   SURGEON:  Loura Back. Clair Alfieri, MD  ASSISTANT:  None.  ANESTHESIA:  4% lidocaine and 0.75% Marcaine in a 50/50 mixture with 10 units/mL of Hylenex added, given as a peribulbar.  ANESTHESIOLOGIST:  Elyse Hsu, MD   COMPLICATIONS:  None.  ESTIMATED BLOOD LOSS:  Less than 1 mL.  DESCRIPTION OF PROCEDURE:  The patient was brought to the operating room and given a peribulbar block.  The patient was then prepped and draped in the usual fashion.  The vertical rectus muscles were imbricated using 5-0 silk sutures.  These sutures were then clamped to the sterile drapes as bridle sutures.  A limbal peritomy was performed extending two clock hours and hemostasis was obtained with cautery.  A partial thickness scleral groove was made at the surgical limbus and dissected anteriorly in a lamellar dissection using an Alcon crescent knife.  The anterior chamber was entered supero-temporally with a Superblade and through the lamellar dissection with a 2.6 mm keratome.  DisCoVisc was used to replace the aqueous and a continuous tear capsulorrhexis was carried out.  Hydrodissection and hydrodelineation were carried out with balanced salt and a 27 gauge canula.  The nucleus was rotated to confirm the effectiveness of the hydrodissection.  Phacoemulsification was carried out using a divide-and-conquer technique.  Total ultrasound time was 1 minute and 38.6 seconds with an average power of  27.6 percent.  CDE 45.25.  Irrigation/aspiration was used to remove the residual cortex.  DisCoVisc was used to inflate the capsule and the internal  incision was enlarged to 3 mm with the crescent knife.  The intraocular lens was folded and inserted into the capsular bag using the AcrySert Delivery System.   Irrigation/aspiration was used to remove the residual DisCoVisc.  Miostat was injected into the anterior chamber through the paracentesis tract to inflate the anterior chamber and induce miosis.  Cefuroxime, 0.1 ml, was also injected through the paracentesis tract. The wound was checked for leaks and none were found. The conjunctiva was closed with cautery and the bridle sutures were removed.  Two drops of 0.3% Vigamox were placed on the eye.   An eye shield was placed on the eye.  The patient was discharged to the recovery room in good condition.   ____________________________ Loura Back Deeann Servidio, MD sad:cb D: 05/20/2012 14:14:18 ET T: 05/20/2012 20:09:55 ET JOB#: [id number]  cc: Remo Lipps A. Lennon Boutwell, MD, <Dictator> Martie Lee MD ELECTRONICALLY SIGNED 06/03/2012 13:56

## 2014-06-11 ENCOUNTER — Ambulatory Visit (INDEPENDENT_AMBULATORY_CARE_PROVIDER_SITE_OTHER): Payer: Medicare Other | Admitting: *Deleted

## 2014-06-11 DIAGNOSIS — E1140 Type 2 diabetes mellitus with diabetic neuropathy, unspecified: Secondary | ICD-10-CM

## 2014-06-11 DIAGNOSIS — M2040 Other hammer toe(s) (acquired), unspecified foot: Secondary | ICD-10-CM | POA: Diagnosis not present

## 2014-06-11 DIAGNOSIS — E1149 Type 2 diabetes mellitus with other diabetic neurological complication: Secondary | ICD-10-CM

## 2014-06-11 NOTE — Progress Notes (Signed)
Pt presents for diabetic shoe pick up, dispensed by [facility] pedorthist

## 2014-07-14 ENCOUNTER — Encounter: Payer: Self-pay | Admitting: Podiatry

## 2014-07-14 ENCOUNTER — Ambulatory Visit (INDEPENDENT_AMBULATORY_CARE_PROVIDER_SITE_OTHER): Payer: Medicare Other | Admitting: Podiatry

## 2014-07-14 ENCOUNTER — Ambulatory Visit (INDEPENDENT_AMBULATORY_CARE_PROVIDER_SITE_OTHER): Payer: Medicare Other

## 2014-07-14 VITALS — BP 173/90 | HR 70 | Resp 17

## 2014-07-14 DIAGNOSIS — M79671 Pain in right foot: Secondary | ICD-10-CM | POA: Diagnosis not present

## 2014-07-14 DIAGNOSIS — R208 Other disturbances of skin sensation: Secondary | ICD-10-CM | POA: Diagnosis not present

## 2014-07-14 DIAGNOSIS — IMO0002 Reserved for concepts with insufficient information to code with codable children: Secondary | ICD-10-CM

## 2014-07-16 NOTE — Progress Notes (Signed)
Patient ID: Rebekah Lowery, female   DOB: 04-05-1933, 81 y.o.   MRN: [id number]  Subjective: 81 year old female presents the office today with complaints of possible numbness to her right second, third, fourth digits which has been ongoing for a couple of days. She states that she was starting to [place] and back last week after being in a car for quite some time she noticed some numbness to her toes intermittently. She denies any history of injury or trauma to the area. She denies any swelling or redness to the area. She said no prior treatment. No other complaints at this time.  Objective: AAO 3, NAD DP/PT pulses palpable, CRT less than 3 seconds Protective sensation intact with Simms Weinstein monofilament, vibratory sensation intact, Achilles tendon reflex intact. There is no areas of pinpoint bony tenderness or pain the vibratory sensation to the digits, metatarsals on the right foot or to other areas of the foot/ankle bilaterally. There is no pain with MTPJ ROM. There is no palpable neuroma identified and there is no pain upon compression the interspaces. There is no reproduction symptoms of medial to lateral compression of the metatarsals. There is mild hammertoe contractures present. There is no overlying edema, erythema, increase in warmth. No open lesions or pre-ulcerative lesions identified bilaterally No pain with calf compression, swelling, warmth, erythema.  Assessment: 81 year old female right second, third, fourth toe numbness likely from hammertoe versus impingement  Plan: -X-rays were obtained and reviewed with the patient.  -Treatment options discussed including all alternatives, risks, and complications -Discussed likely etiology of her symptoms. -Dispensed metatarsal pads to help offload the metatarsal heads to take pressure off the area. -Continue to monitor the area of symptoms worsened to call the office. -Follow-up as scheduled. In the meantime call the office with any  questions, concerns, change in symptoms.  Celesta Gentile, DPM

## 2014-07-29 ENCOUNTER — Encounter: Payer: Self-pay | Admitting: *Deleted

## 2014-07-29 DIAGNOSIS — Z886 Allergy status to analgesic agent status: Secondary | ICD-10-CM | POA: Diagnosis not present

## 2014-07-29 DIAGNOSIS — H269 Unspecified cataract: Secondary | ICD-10-CM | POA: Diagnosis present

## 2014-07-29 DIAGNOSIS — H2511 Age-related nuclear cataract, right eye: Secondary | ICD-10-CM | POA: Diagnosis not present

## 2014-07-29 DIAGNOSIS — Z885 Allergy status to narcotic agent status: Secondary | ICD-10-CM | POA: Diagnosis not present

## 2014-07-29 DIAGNOSIS — Z853 Personal history of malignant neoplasm of breast: Secondary | ICD-10-CM | POA: Diagnosis not present

## 2014-07-29 DIAGNOSIS — Z9013 Acquired absence of bilateral breasts and nipples: Secondary | ICD-10-CM | POA: Diagnosis not present

## 2014-07-29 DIAGNOSIS — Z9849 Cataract extraction status, unspecified eye: Secondary | ICD-10-CM | POA: Diagnosis not present

## 2014-07-29 DIAGNOSIS — I341 Nonrheumatic mitral (valve) prolapse: Secondary | ICD-10-CM | POA: Diagnosis not present

## 2014-07-29 DIAGNOSIS — E1151 Type 2 diabetes mellitus with diabetic peripheral angiopathy without gangrene: Secondary | ICD-10-CM | POA: Diagnosis not present

## 2014-07-29 DIAGNOSIS — Z8673 Personal history of transient ischemic attack (TIA), and cerebral infarction without residual deficits: Secondary | ICD-10-CM | POA: Diagnosis not present

## 2014-07-29 DIAGNOSIS — Z90710 Acquired absence of both cervix and uterus: Secondary | ICD-10-CM | POA: Diagnosis not present

## 2014-07-29 DIAGNOSIS — Z79810 Long term (current) use of selective estrogen receptor modulators (SERMs): Secondary | ICD-10-CM | POA: Diagnosis not present

## 2014-07-29 DIAGNOSIS — Z79899 Other long term (current) drug therapy: Secondary | ICD-10-CM | POA: Diagnosis not present

## 2014-07-29 DIAGNOSIS — Z9889 Other specified postprocedural states: Secondary | ICD-10-CM | POA: Diagnosis not present

## 2014-07-29 DIAGNOSIS — I10 Essential (primary) hypertension: Secondary | ICD-10-CM | POA: Diagnosis not present

## 2014-08-03 ENCOUNTER — Ambulatory Visit: Payer: Medicare Other | Admitting: Anesthesiology

## 2014-08-03 ENCOUNTER — Encounter
Admission: RE | Disposition: A | Discharge status: Home / Self Care | Payer: Self-pay | Source: Ambulatory Visit | Attending: Ophthalmology

## 2014-08-03 ENCOUNTER — Ambulatory Visit
Admission: RE | Admit: 2014-08-03 | Discharge: 2014-08-03 | Disposition: A | Discharge status: Home / Self Care | Payer: Medicare Other | Source: Ambulatory Visit | Attending: Ophthalmology | Admitting: Ophthalmology

## 2014-08-03 ENCOUNTER — Encounter: Payer: Self-pay | Admitting: Anesthesiology

## 2014-08-03 DIAGNOSIS — Z853 Personal history of malignant neoplasm of breast: Secondary | ICD-10-CM | POA: Insufficient documentation

## 2014-08-03 DIAGNOSIS — Z9889 Other specified postprocedural states: Secondary | ICD-10-CM | POA: Insufficient documentation

## 2014-08-03 DIAGNOSIS — Z9013 Acquired absence of bilateral breasts and nipples: Secondary | ICD-10-CM | POA: Insufficient documentation

## 2014-08-03 DIAGNOSIS — Z90710 Acquired absence of both cervix and uterus: Secondary | ICD-10-CM | POA: Insufficient documentation

## 2014-08-03 DIAGNOSIS — Z79899 Other long term (current) drug therapy: Secondary | ICD-10-CM | POA: Insufficient documentation

## 2014-08-03 DIAGNOSIS — Z886 Allergy status to analgesic agent status: Secondary | ICD-10-CM | POA: Insufficient documentation

## 2014-08-03 DIAGNOSIS — H2511 Age-related nuclear cataract, right eye: Secondary | ICD-10-CM | POA: Diagnosis not present

## 2014-08-03 DIAGNOSIS — Z8673 Personal history of transient ischemic attack (TIA), and cerebral infarction without residual deficits: Secondary | ICD-10-CM | POA: Insufficient documentation

## 2014-08-03 DIAGNOSIS — Z79810 Long term (current) use of selective estrogen receptor modulators (SERMs): Secondary | ICD-10-CM | POA: Insufficient documentation

## 2014-08-03 DIAGNOSIS — E1151 Type 2 diabetes mellitus with diabetic peripheral angiopathy without gangrene: Secondary | ICD-10-CM | POA: Insufficient documentation

## 2014-08-03 DIAGNOSIS — Z885 Allergy status to narcotic agent status: Secondary | ICD-10-CM | POA: Insufficient documentation

## 2014-08-03 DIAGNOSIS — Z9849 Cataract extraction status, unspecified eye: Secondary | ICD-10-CM | POA: Insufficient documentation

## 2014-08-03 DIAGNOSIS — I10 Essential (primary) hypertension: Secondary | ICD-10-CM | POA: Insufficient documentation

## 2014-08-03 DIAGNOSIS — I341 Nonrheumatic mitral (valve) prolapse: Secondary | ICD-10-CM | POA: Insufficient documentation

## 2014-08-03 HISTORY — DX: Peripheral vascular disease, unspecified: I73.9

## 2014-08-03 HISTORY — DX: Nonrheumatic mitral (valve) prolapse: I34.1

## 2014-08-03 HISTORY — PX: CATARACT EXTRACTION W/PHACO: SHX586

## 2014-08-03 LAB — GLUCOSE, CAPILLARY: Glucose-Capillary: 147 mg/dL — ABNORMAL HIGH (ref 65–99)

## 2014-08-03 LAB — POTASSIUM: Potassium: 4 mmol/L (ref 3.5–5.1)

## 2014-08-03 SURGERY — PHACOEMULSIFICATION, CATARACT, WITH IOL INSERTION
Anesthesia: LOCAL | Site: Eye | Laterality: Right | Wound class: Clean

## 2014-08-03 MED ORDER — TETRACAINE HCL 0.5 % OP SOLN
OPHTHALMIC | Status: DC | PRN
  Administered 2014-08-03: 2 [drp] via OPHTHALMIC

## 2014-08-03 MED ORDER — BUPIVACAINE HCL (PF) 0.75 % IJ SOLN
INTRAMUSCULAR | Status: AC
  Filled 2014-08-03: qty 10

## 2014-08-03 MED ORDER — NA CHONDROIT SULF-NA HYALURON 40-17 MG/ML IO SOLN
INTRAOCULAR | Status: DC | PRN
  Administered 2014-08-03: 1 mL via INTRAOCULAR

## 2014-08-03 MED ORDER — NA CHONDROIT SULF-NA HYALURON 40-17 MG/ML IO SOLN
INTRAOCULAR | Status: AC
  Filled 2014-08-03: qty 1

## 2014-08-03 MED ORDER — LIDOCAINE HCL (PF) 4 % IJ SOLN
INTRAMUSCULAR | Status: AC
  Filled 2014-08-03: qty 5

## 2014-08-03 MED ORDER — CYCLOPENTOLATE HCL 2 % OP SOLN
OPHTHALMIC | Status: AC
  Administered 2014-08-03: 1 [drp] via OPHTHALMIC
  Filled 2014-08-03: qty 2

## 2014-08-03 MED ORDER — MOXIFLOXACIN HCL 0.5 % OP SOLN
OPHTHALMIC | Status: AC
  Administered 2014-08-03: 1 [drp] via OPHTHALMIC
  Filled 2014-08-03: qty 3

## 2014-08-03 MED ORDER — CEFUROXIME OPHTHALMIC INJECTION 1 MG/0.1 ML
INJECTION | OPHTHALMIC | Status: AC
  Filled 2014-08-03: qty 0.1

## 2014-08-03 MED ORDER — HYALURONIDASE HUMAN 150 UNIT/ML IJ SOLN
INTRAMUSCULAR | Status: AC
  Filled 2014-08-03: qty 1

## 2014-08-03 MED ORDER — MOXIFLOXACIN HCL 0.5 % OP SOLN
1.0000 [drp] | OPHTHALMIC | Status: AC | PRN
  Administered 2014-08-03 (×3): 1 [drp] via OPHTHALMIC

## 2014-08-03 MED ORDER — TETRACAINE HCL 0.5 % OP SOLN
OPHTHALMIC | Status: AC
  Filled 2014-08-03: qty 2

## 2014-08-03 MED ORDER — ALFENTANIL 500 MCG/ML IJ INJ
INJECTION | INTRAMUSCULAR | Status: DC | PRN
  Administered 2014-08-03: 250 ug via INTRAVENOUS

## 2014-08-03 MED ORDER — CARBACHOL 0.01 % IO SOLN
INTRAOCULAR | Status: DC | PRN
  Administered 2014-08-03: 0.5 mL via INTRAOCULAR

## 2014-08-03 MED ORDER — PHENYLEPHRINE HCL 10 % OP SOLN
1.0000 [drp] | OPHTHALMIC | Status: AC | PRN
  Administered 2014-08-03 (×4): 1 [drp] via OPHTHALMIC

## 2014-08-03 MED ORDER — SODIUM CHLORIDE 0.9 % IV SOLN
INTRAVENOUS | Status: DC
  Administered 2014-08-03 (×2): via INTRAVENOUS

## 2014-08-03 MED ORDER — EPINEPHRINE HCL 1 MG/ML IJ SOLN
INTRAMUSCULAR | Status: AC
  Filled 2014-08-03: qty 1

## 2014-08-03 MED ORDER — MOXIFLOXACIN HCL 0.5 % OP SOLN
OPHTHALMIC | Status: DC | PRN
  Administered 2014-08-03: 1 [drp] via OPHTHALMIC

## 2014-08-03 MED ORDER — BSS IO SOLN
INTRAOCULAR | Status: DC | PRN
  Administered 2014-08-03: 200 mL

## 2014-08-03 MED ORDER — PHENYLEPHRINE HCL 10 % OP SOLN
OPHTHALMIC | Status: AC
  Administered 2014-08-03: 1 [drp] via OPHTHALMIC
  Filled 2014-08-03: qty 5

## 2014-08-03 MED ORDER — CEFUROXIME OPHTHALMIC INJECTION 1 MG/0.1 ML
INJECTION | OPHTHALMIC | Status: DC | PRN
  Administered 2014-08-03: 0.1 mL via INTRACAMERAL

## 2014-08-03 MED ORDER — LIDOCAINE HCL (PF) 4 % IJ SOLN
INTRAMUSCULAR | Status: DC | PRN
  Administered 2014-08-03: 7 mL via OPHTHALMIC

## 2014-08-03 MED ORDER — LIDOCAINE HCL (PF) 1 % IJ SOLN
INTRAOCULAR | Status: DC | PRN
  Administered 2014-08-03: 4 mL via OPHTHALMIC

## 2014-08-03 MED ORDER — CYCLOPENTOLATE HCL 2 % OP SOLN
1.0000 [drp] | OPHTHALMIC | Status: AC | PRN
Start: 2014-08-03 — End: 2014-08-03
  Administered 2014-08-03 (×4): 1 [drp] via OPHTHALMIC

## 2014-08-03 SURGICAL SUPPLY — 29 items
CANNULA ANT/CHMB 27GA (MISCELLANEOUS) ×2 IMPLANT
CORD BIP STRL DISP 12FT (MISCELLANEOUS) ×2 IMPLANT
CUP MEDICINE 2OZ PLAST GRAD ST (MISCELLANEOUS) ×2 IMPLANT
DRAPE XRAY CASSETTE 23X24 (DRAPES) ×2 IMPLANT
ERASER HMR WETFIELD 18G (MISCELLANEOUS) ×2 IMPLANT
GLOVE BIO SURGEON STRL SZ8 (GLOVE) ×2 IMPLANT
GLOVE SURG LX 6.5 MICRO (GLOVE) ×1
GLOVE SURG LX 8.0 MICRO (GLOVE) ×1
GLOVE SURG LX STRL 6.5 MICRO (GLOVE) ×1 IMPLANT
GLOVE SURG LX STRL 8.0 MICRO (GLOVE) ×1 IMPLANT
GOWN STRL REUS W/ TWL LRG LVL3 (GOWN DISPOSABLE) ×1 IMPLANT
GOWN STRL REUS W/ TWL XL LVL3 (GOWN DISPOSABLE) ×1 IMPLANT
GOWN STRL REUS W/TWL LRG LVL3 (GOWN DISPOSABLE) ×1
GOWN STRL REUS W/TWL XL LVL3 (GOWN DISPOSABLE) ×1
LENS IOL ACRYSOF IQ 22.0 (Intraocular Lens) ×2 IMPLANT
PACK CATARACT (MISCELLANEOUS) ×2 IMPLANT
PACK CATARACT DINGLEDEIN LX (MISCELLANEOUS) ×2 IMPLANT
PACK EYE AFTER SURG (MISCELLANEOUS) ×2 IMPLANT
SHLD EYE VISITEC  UNIV (MISCELLANEOUS) ×2 IMPLANT
SOL BSS BAG (MISCELLANEOUS) ×2
SOL PREP PVP 2OZ (MISCELLANEOUS) ×2
SOLUTION BSS BAG (MISCELLANEOUS) ×1 IMPLANT
SOLUTION PREP PVP 2OZ (MISCELLANEOUS) ×1 IMPLANT
SUT SILK 5-0 (SUTURE) ×2 IMPLANT
SYR 3ML LL SCALE MARK (SYRINGE) ×2 IMPLANT
SYR 5ML LL (SYRINGE) ×2 IMPLANT
SYR TB 1ML 27GX1/2 LL (SYRINGE) ×2 IMPLANT
WATER STERILE IRR 1000ML POUR (IV SOLUTION) ×2 IMPLANT
WIPE NON LINTING 3.25X3.25 (MISCELLANEOUS) ×2 IMPLANT

## 2014-08-03 NOTE — Discharge Instructions (Addendum)
See handoutAMBULATORY SURGERY  °DISCHARGE INSTRUCTIONS ° ° °1) The drugs that you were given will stay in your system until tomorrow so for the next 24 hours you should not: ° °A) Drive an automobile °B) Make any legal decisions °C) Drink any alcoholic beverage ° ° °2) You may resume regular meals tomorrow.  Today it is better to start with liquids and gradually work up to solid foods. ° °You may eat anything you prefer, but it is better to start with liquids, then soup and crackers, and gradually work up to solid foods. ° ° °3) Please notify your doctor immediately if you have any unusual bleeding, trouble breathing, redness and pain at the surgery site, drainage, fever, or pain not relieved by medication. ° ° ° °4) Additional Instructions: ° ° ° ° ° ° ° °Please contact your physician with any problems or Same Day Surgery at [phone], Monday through Friday 6 am to 4 pm, or [facility] Main number at [phone].AMBULATORY SURGERY  °DISCHARGE INSTRUCTIONS ° ° °5) The drugs that you were given will stay in your system until tomorrow so for the next 24 hours you should not: ° °D) Drive an automobile °E) Make any legal decisions °F) Drink any alcoholic beverage ° ° °6) You may resume regular meals tomorrow.  Today it is better to start with liquids and gradually work up to solid foods. ° °You may eat anything you prefer, but it is better to start with liquids, then soup and crackers, and gradually work up to solid foods. ° ° °7) Please notify your doctor immediately if you have any unusual bleeding, trouble breathing, redness and pain at the surgery site, drainage, fever, or pain not relieved by medication. ° ° ° °8) Additional Instructions: ° ° ° ° ° ° ° °Please contact your physician with any problems or Same Day Surgery at [phone], Monday through Friday 6 am to 4 pm, or [facility] Main number at [phone]. °

## 2014-08-03 NOTE — Interval H&P Note (Signed)
History and Physical Interval Note:  08/03/2014 7:17 AM  Rebekah Lowery  has presented today for surgery, with the diagnosis of cataract  The various methods of treatment have been discussed with the patient and family. After consideration of risks, benefits and other options for treatment, the patient has consented to  Procedure(s): CATARACT EXTRACTION PHACO AND INTRAOCULAR LENS PLACEMENT ([place]) (Right) as a surgical intervention .  The patient's history has been reviewed, patient examined, no change in status, stable for surgery.  I have reviewed the patient's chart and labs.  Questions were answered to the patient's satisfaction.     Hanan Mcwilliams

## 2014-08-03 NOTE — Transfer of Care (Signed)
Immediate Anesthesia Transfer of Care Note  Patient: Rebekah Lowery  Procedure(s) Performed: Procedure(s) with comments: CATARACT EXTRACTION PHACO AND INTRAOCULAR LENS PLACEMENT ([facility]) (Right) - [place]: 01:17 AP%: 24.0 CDE: 32.46  Fluid Lot# [id number]   Patient Location: PACU and Short Stay  Anesthesia Type:MAC  Level of Consciousness: awake and patient cooperative  Airway & Oxygen Therapy: Patient Spontanous Breathing  Post-op Assessment: Report given to RN  Post vital signs: Reviewed and stable  Last Vitals:  Filed Vitals:   08/03/14 0619  BP: 157/66  Pulse: 61  Temp: 36.9 C  Resp: 14    Complications: No apparent anesthesia complications

## 2014-08-03 NOTE — Anesthesia Postprocedure Evaluation (Signed)
  Anesthesia Post-op Note  Patient: Rebekah Lowery  Procedure(s) Performed: Procedure(s) with comments: CATARACT EXTRACTION PHACO AND INTRAOCULAR LENS PLACEMENT ([facility]) (Right) - [place]: 01:17 AP%: 24.0 CDE: 32.46  Fluid Lot# [id number]   Anesthesia type:No value filed.  Patient location: PACU  Post pain: Pain level controlled  Post assessment: Post-op Vital signs reviewed, Patient's Cardiovascular Status Stable, Respiratory Function Stable, Patent Airway and No signs of Nausea or vomiting  Post vital signs: Reviewed and stable  Last Vitals:  Filed Vitals:   08/03/14 0822  BP: 167/79  Pulse: 61  Temp: 37.1 C  Resp: 16    Level of consciousness: awake, alert  and patient cooperative  Complications: No apparent anesthesia complications

## 2014-08-03 NOTE — H&P (Signed)
See scanned H&P

## 2014-08-03 NOTE — Op Note (Signed)
Date of Surgery: 08/03/2014 Date of Dictation: 08/03/2014 8:06 AM Pre-operative Diagnosis:  Nuclear Sclerotic Cataract right Eye Post-operative Diagnosis: same Procedure performed: Extra-capsular Cataract Extraction (ECCE) with placement of a posterior chamber intraocular lens (IOL) right Eye IOL:  Implant Name Type Inv. Item Serial No. Manufacturer Lot No. LRB No. Used  Lens Acrysof     [id number]     Right 1   Anesthesia: 2% Lidocaine and 4% Marcaine in a 50/50 mixture with 10 unites/ml of Hylenex given as a peribulbar Anesthesiologist: CRNA: Courtney Paris, CRNA Complications: none Estimated Blood Loss: less than 1 ml  Description of procedure:  The patient was given anesthesia and sedation via intravenous access. The patient was then prepped and draped in the usual fashion. A 25-gauge needle was bent for initiating the capsulorhexis. A 5-0 silk suture was placed through the conjunctiva superior and inferiorly to serve as bridle sutures. Hemostasis was obtained at the superior limbus using an eraser cautery. A partial thickness groove was made at the anterior surgical limbus with a 64 Beaver blade and this was dissected anteriorly with an Avaya. The anterior chamber was entered at 10 o'clock with a 1.0 mm paracentesis knife and through the lamellar dissection with a 2.6 mm Alcon keratome. Epi-Shugarcaine 0.5 CC [9 cc BSS Plus (Alcon), 3 cc 4% preservative-free lidocaine (Hospira) and 4 cc 1:1000 preservative-free, bisulfite-free epinephrine] was injected via the paracentesis tract.   DiscoVisc was injected to replace the aqueous and a continuous tear curvilinear capsulorhexis was performed using a bent 25-gauge needle.  Balance salt on a syringe was used to perform hydro-dissection and phacoemulsification was carried out using a divide and conquer technique. Procedure(s) with comments: CATARACT EXTRACTION PHACO AND INTRAOCULAR LENS PLACEMENT ([facility]) (Right) - [place]: 01:17 AP%:  24.0 CDE: 32.46  Fluid Lot# [id number] . Irrigation/aspiration was used to remove the residual cortex and the capsular bag was inflated with DiscoVisc. The intraocular lens was inserted into the capsular bag using a pre-loaded UltraSert Delivery System. Irrigation/aspiration was used to remove the residual DiscoVisc. The wound was inflated with balanced salt and checked for leaks. None were found. Miostat was injected via the paracentesis track and 0.1 ml of cefuroxime containing 1 mg of drug  was injected via the paracentesis track. The wound was checked for leaks again and none were found.   The bridal sutures were removed and two drops of Vigamox were placed on the eye. An eye shield was placed to protect the eye and the patient was discharged to the recovery area in good condition.   Kada Friesen MD

## 2014-08-03 NOTE — Anesthesia Preprocedure Evaluation (Addendum)
Anesthesia Evaluation  Patient identified by MRN, date of birth, ID band Patient awake    Reviewed: Allergy & Precautions, H&P , NPO status , Patient's Chart, lab work & pertinent test results, reviewed documented beta blocker date and time   History of Anesthesia Complications Negative for: history of anesthetic complications  Airway Mallampati: II  TM Distance: >3 FB Neck ROM: full    Dental  (+) Upper Dentures, Partial Lower, Edentulous Upper   Pulmonary neg pulmonary ROS,  breath sounds clear to auscultation  Pulmonary exam normal       Cardiovascular Exercise Tolerance: Good hypertension, - angina+ Peripheral Vascular Disease - CAD, - Past MI, - CABG and - CHF negative cardio ROS  Rhythm:regular Rate:Normal     Neuro/Psych CVA, No Residual Symptoms negative psych ROS   GI/Hepatic negative GI ROS, Neg liver ROS,   Endo/Other  diabetes, Well Controlled, Oral Hypoglycemic Agents  Renal/GU negative Renal ROS  negative genitourinary   Musculoskeletal   Abdominal   Peds  Hematology negative hematology ROS (+)   Anesthesia Other Findings Past Medical History:   Anemia                                                       Stroke                                          04/15/09      Diabetes mellitus                                            Hypertension                                                 Change in voice                                              Cancer                                                         Comment:left breast   Family history of pancreatic cancer                          Family history of lung cancer                                Malignant neoplasm of breast (female), unspeci* 12/11/2011   MVP (mitral valve prolapse)  PAD (peripheral artery disease)                              Reproductive/Obstetrics negative OB ROS                             Anesthesia Physical Anesthesia Plan  ASA: II  Anesthesia Plan: MAC   Post-op Pain Management:    Induction:   Airway Management Planned:   Additional Equipment:   Intra-op Plan:   Post-operative Plan:   Informed Consent: I have reviewed the patients History and Physical, chart, labs and discussed the procedure including the risks, benefits and alternatives for the proposed anesthesia with the patient or authorized representative who has indicated his/her understanding and acceptance.   Dental Advisory Given  Plan Discussed with: CRNA and Anesthesiologist  Anesthesia Plan Comments:         Anesthesia Quick Evaluation

## 2014-08-06 IMAGING — MG MM MAMMO DIAGNOSTIC UNILATERAL*R*
1 series · 4 of 4 positions shown · non-contrast
Comparison: none

REASON FOR EXAM: HX BRST CA LT MAST
COMMENTS:

PROCEDURE:     MAM - MAM DGTL UNI MAM RT BREAST W/CAD  - October 02, 2011 [DATE]
RESULT:
Comparisons: 10/13/2008, 08/26/2007, and mammograms from The [REDACTED] in

[R CC · right · 4 of 4 slices shown]
[im 1/4]
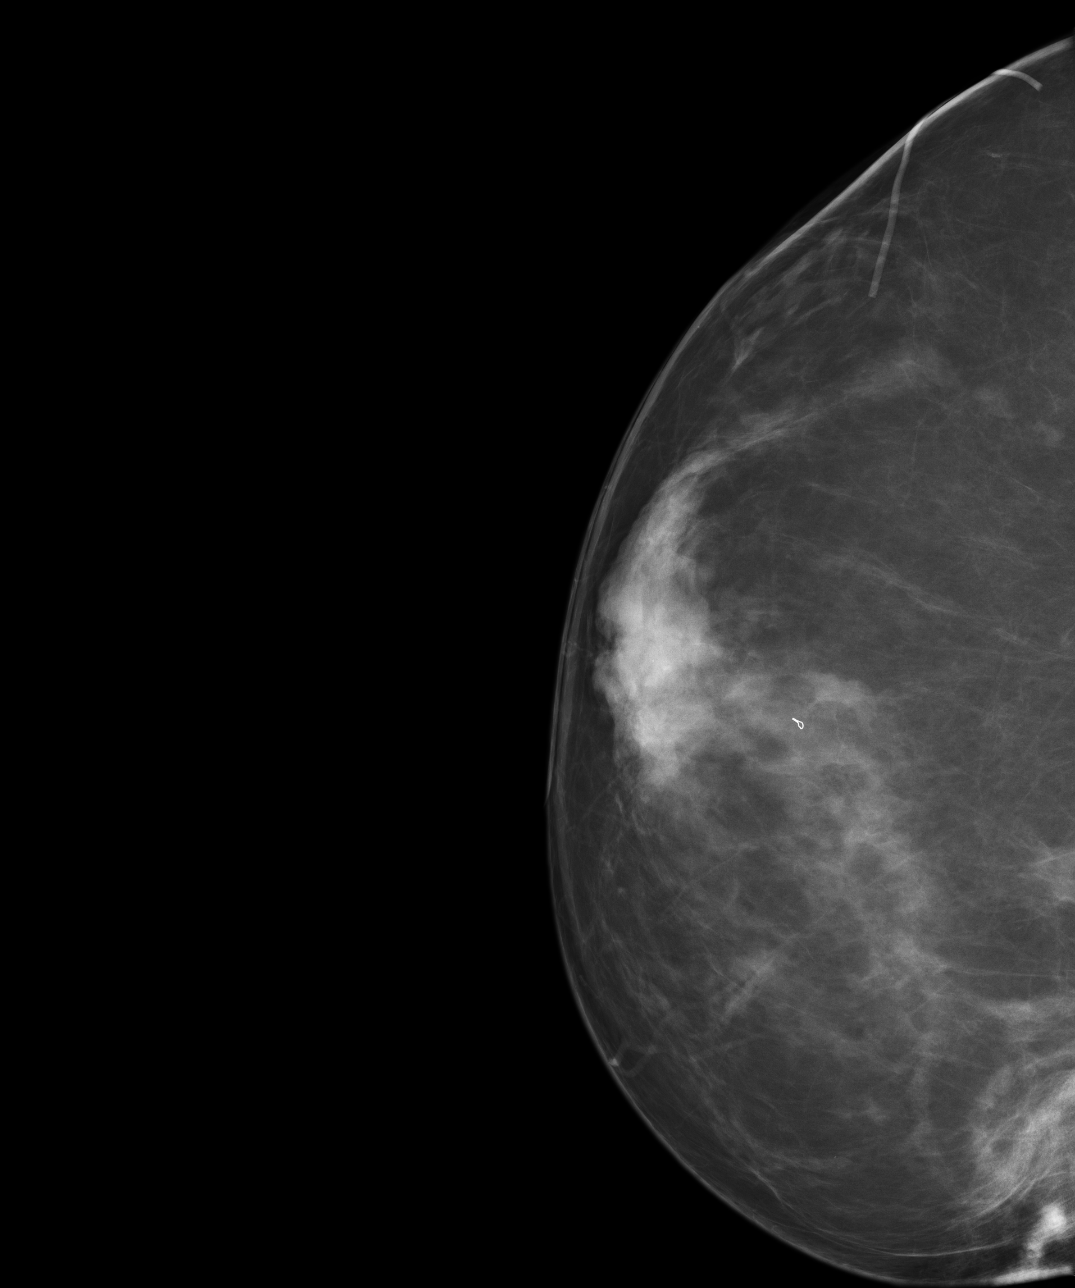
[im 2/4]
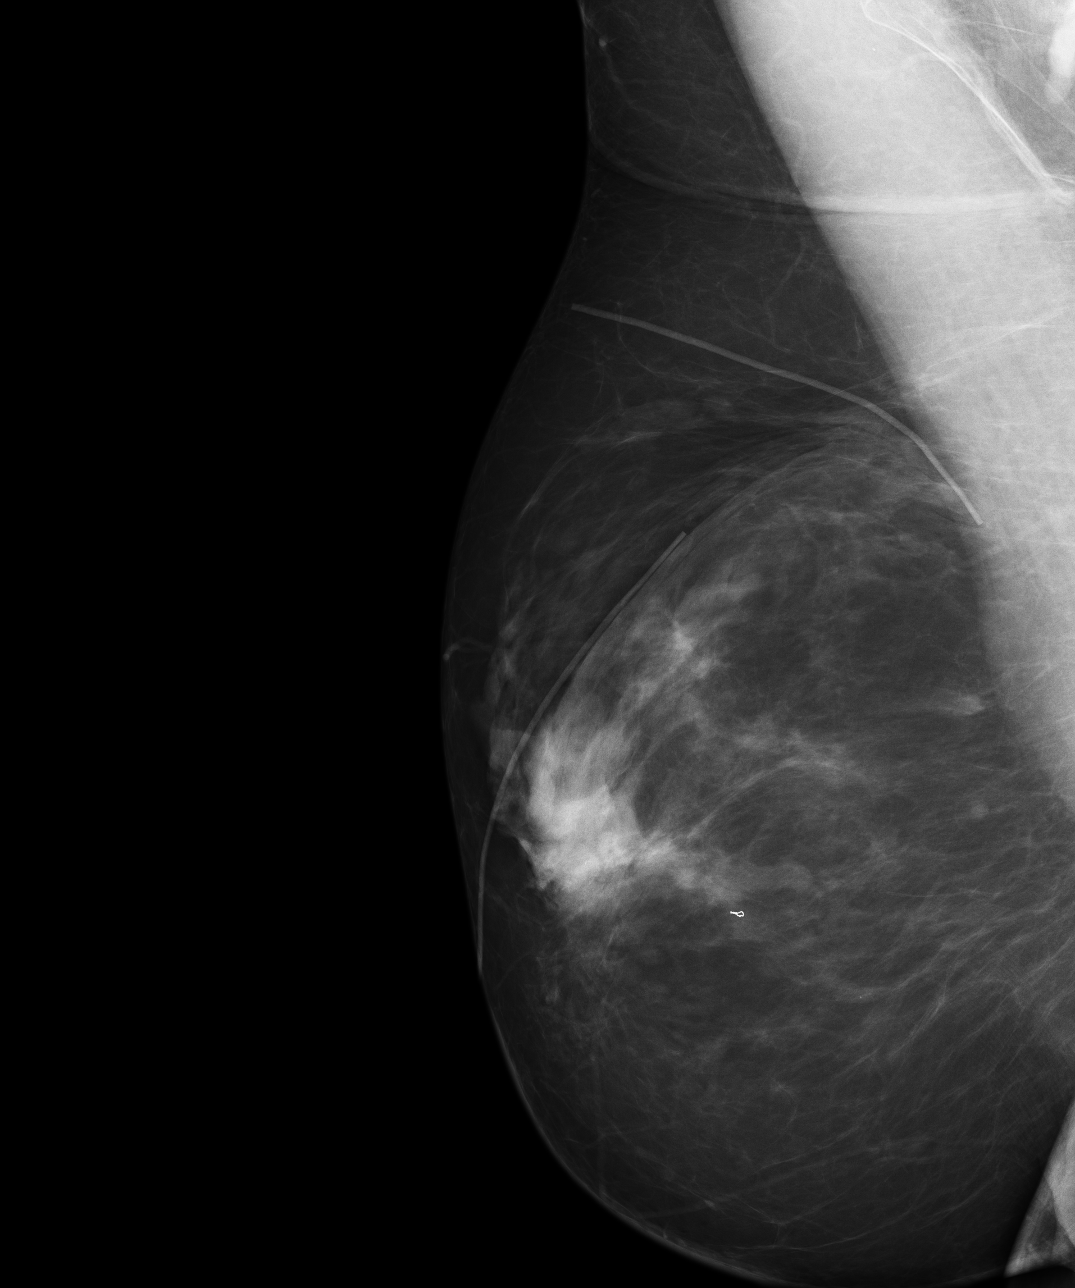
[im 3/4]
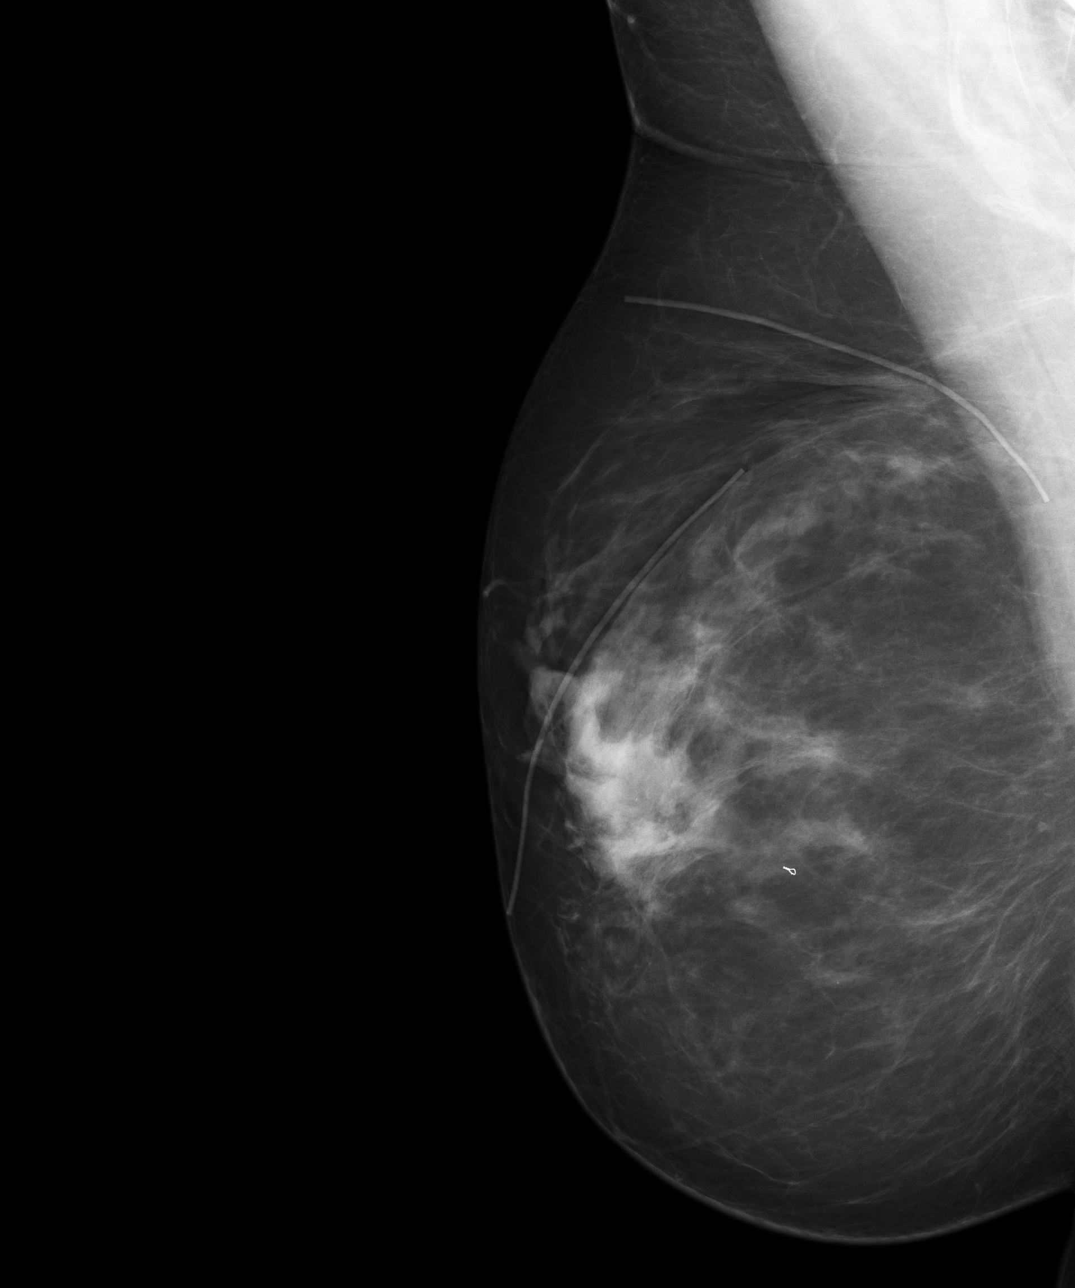
[im 4/4]
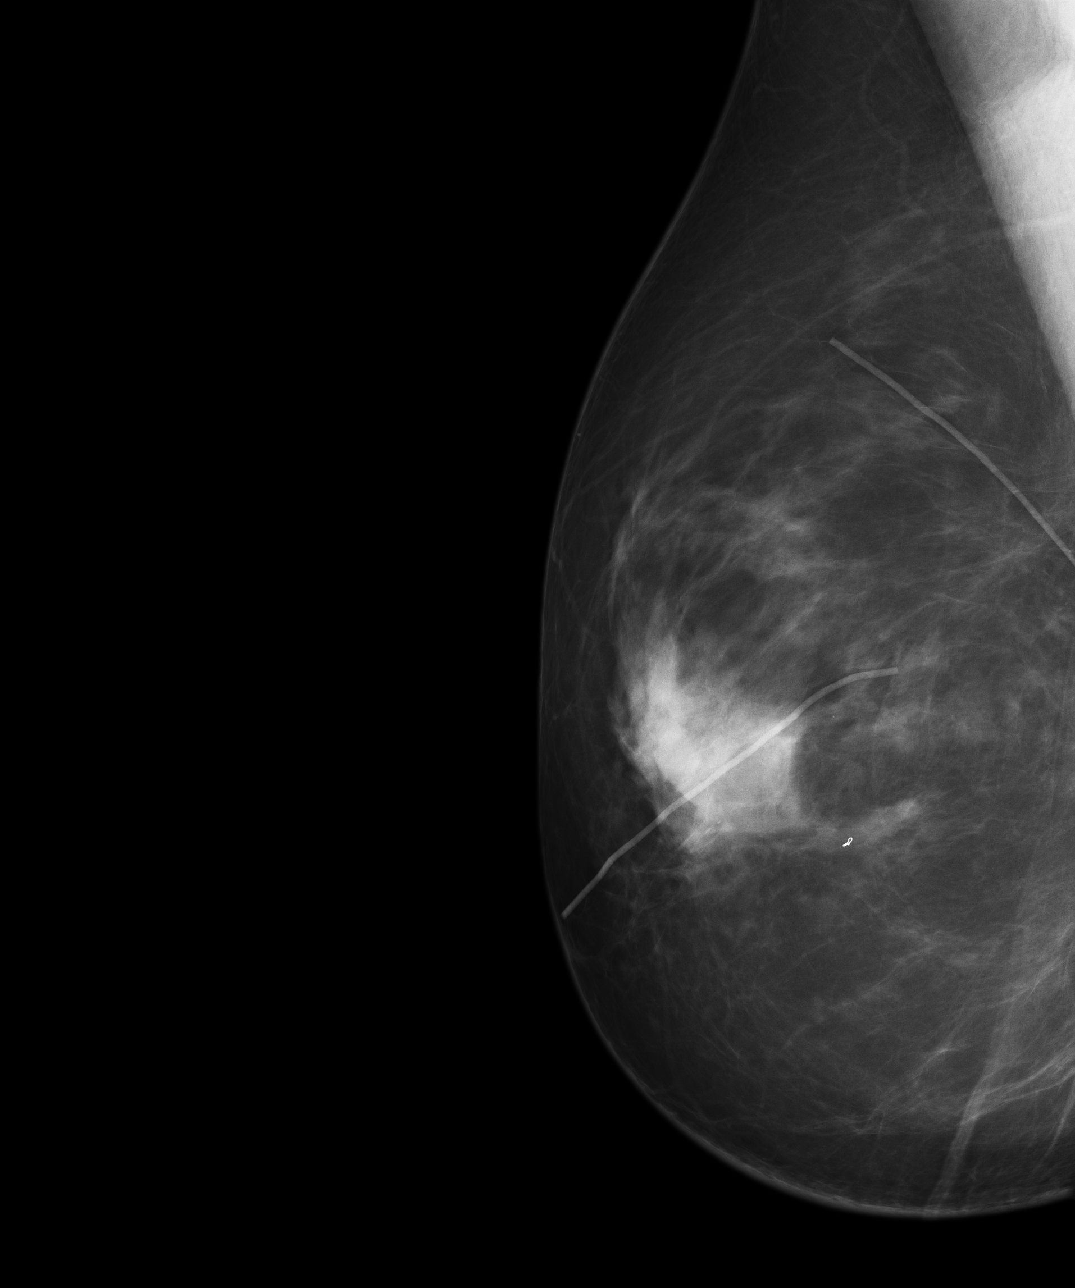

[4 of 4 positions shown; findings below may reference images not displayed]

FINDINGS: The mammograms were submitted for interpretation 10/11/2011. The breast
tissue is heterogeneously dense, which may lower the sensitivity of
mammography. The patient is status post left mastectomy. There is a small
asymmetry in the central aspect of the right CC view, just lateral to the
biopsy clip marker. This likely correlates with an area of density in the
central aspect of the MLO and true lateral views. Additionally, there is a
small asymmetry in the far posteromedial aspect of the right CC view. This
may be related to the positioning of the breast on this image.
IMPRESSION: BI-RADS: Category 0 - Needs Addtional Imaging Evaluation

Recommend spot compression magnification views of the two asymmetries in the
right CC view as well as the MLO and true lateral views. A repeat CC
mammogram is recommended to improve the positioning of the medial breast
tissue.

A NEGATIVE MAMMOGRAM REPORT DOES NOT PRECLUDE BIOPSY OR OTHER EVALUATION OF
A CLINICALLY PALPABLE OR OTHERWISE SUSPICIOUS MASS OR LESION. BREAST CANCER
MAY NOT BE DETECTED BY MAMMOGRAPHY IN UP TO 10% OF CASES.

## 2014-08-11 ENCOUNTER — Ambulatory Visit (INDEPENDENT_AMBULATORY_CARE_PROVIDER_SITE_OTHER): Payer: Medicare Other | Admitting: Podiatry

## 2014-08-11 DIAGNOSIS — M79673 Pain in unspecified foot: Secondary | ICD-10-CM | POA: Diagnosis not present

## 2014-08-11 DIAGNOSIS — D492 Neoplasm of unspecified behavior of bone, soft tissue, and skin: Secondary | ICD-10-CM

## 2014-08-11 DIAGNOSIS — G5781 Other specified mononeuropathies of right lower limb: Secondary | ICD-10-CM

## 2014-08-11 DIAGNOSIS — Q666 Other congenital valgus deformities of feet: Secondary | ICD-10-CM

## 2014-08-11 DIAGNOSIS — G5761 Lesion of plantar nerve, right lower limb: Secondary | ICD-10-CM

## 2014-08-11 DIAGNOSIS — E1149 Type 2 diabetes mellitus with other diabetic neurological complication: Secondary | ICD-10-CM

## 2014-08-11 DIAGNOSIS — E1140 Type 2 diabetes mellitus with diabetic neuropathy, unspecified: Secondary | ICD-10-CM | POA: Diagnosis not present

## 2014-08-11 NOTE — Progress Notes (Signed)
Patient ID: Rebekah Lowery, female   DOB: [birth year], 81 y.o.   MRN: [id number]  Subjective: 81 year old female presents the office today for follow-up valuation numbness to her right foot on second, third, fourth digits. She states that the numbness is intermittent and not continual. She denies any history of injury or trauma. She denies any swelling or any redness. She's continue with the inserts and metatarsal pads which helps some. No other complaints at this time in no acute changes since last appointment.  Objective: AAO 3, NAD DP/PT pulses palpable, CRT less than 3 seconds Protective sensation mildly decreased with Sims once a month, Achilles tendon reflex intact. There is subjective numbness of right foot on the digits 2, 3, 4. There is mild tenderness to palpation along the second interspace on the right foot. There is no palpable neuroma identified and there is no pain with medial to lateral compression of the metatarsals. There is no specific area pinpoint bony tenderness or pain the vibratory sensation. No pain with MTPJ range of motion. There is no overlying edema, erythema, increase in warmth. No open lesions or pre-ulcer lesions identified. No pain with calf compression, swelling, warmth, erythema.  Assessment: 81 year old female with continued right second through fourth toe numbness, possible neuroma versus neuropathy  Plan: -Treatment options discussed including all alternatives, risks, and complications -I discussed steroid injection to the right second interspace over the area of maximal alternatives. I discussed with her risks, occasions the injection for which she understands and verbally consents the injection. Under sterile conditions a total of 1 mL mixture of dexamethasone phosphate and 0.5% Marcaine plain was infiltrated into the second interspace without complications. She tolerated the injection well without any complications. Post injection care was discussed with the  patient. Monitor blood sugar -Continue offloading pads. -Continue diabetic inserts -Follow-up 4 weeks or sooner if any problems arise. In the meantime, encouraged to call the office with any questions, concerns, change in symptoms.   Celesta Gentile, DPM

## 2014-08-17 NOTE — Addendum Note (Signed)
Addendum  created 08/17/14 4604 by Iver Nestle, MD   Modules edited: Anesthesia Responsible Staff

## 2014-08-21 IMAGING — US ULTRASOUND RIGHT BREAST
1 series · 13 of 21 positions shown · non-contrast
Comparison: 09/15/2010, [DATE],2757, mammograms from the [REDACTED] in

REASON FOR EXAM: AV RT 2 ASYMMETRIES
COMMENTS:

PROCEDURE:     US  - US BREAST RIGHT  - October 17, 2011  [DATE]
RESULT:

[Series 1: ultrasound right breast · 0.08mm/px · 13 of 21 slices shown]
[im 1/21]
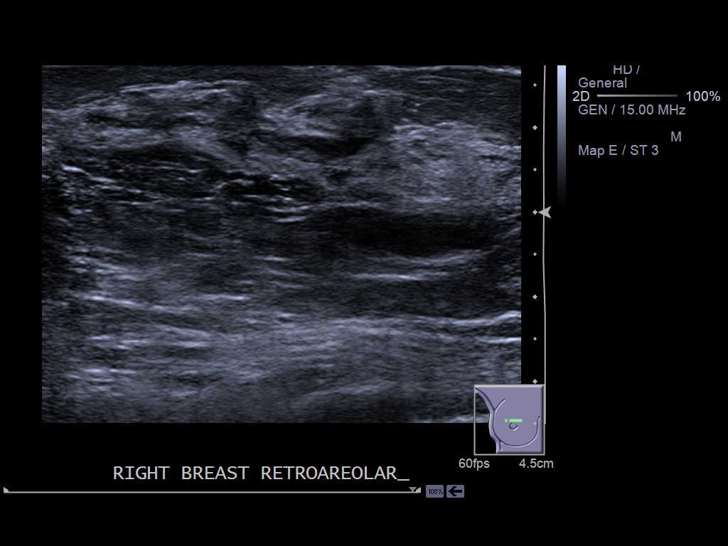
[im 3/21]
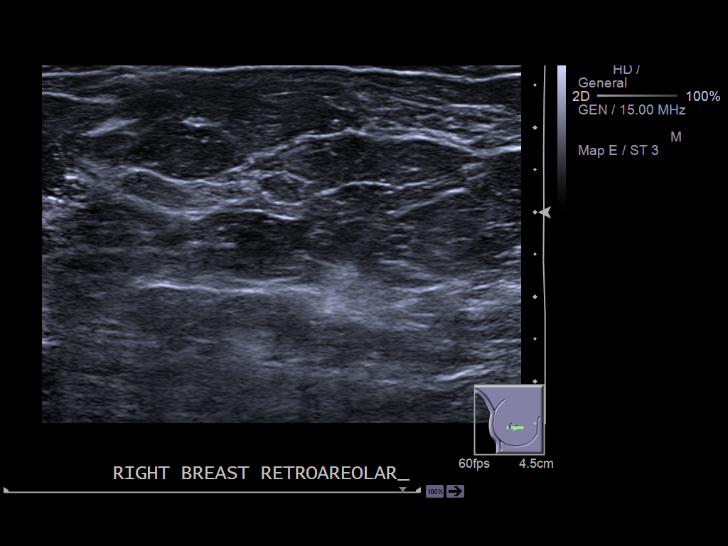
[im 5/21]
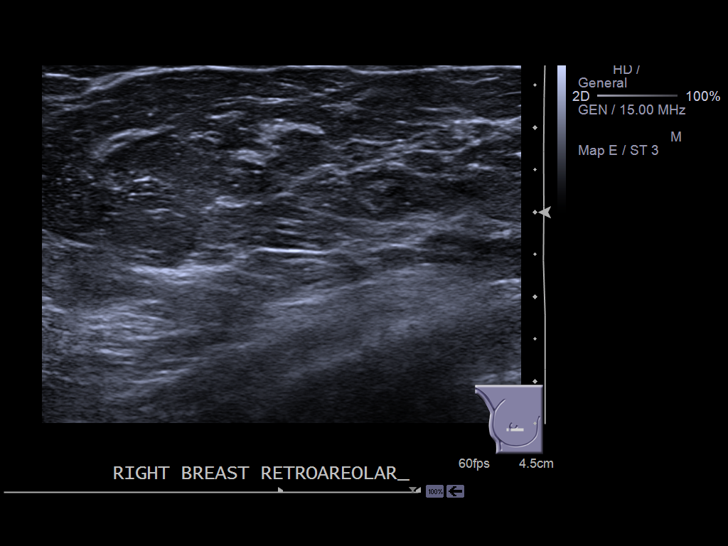
[im 6/21]
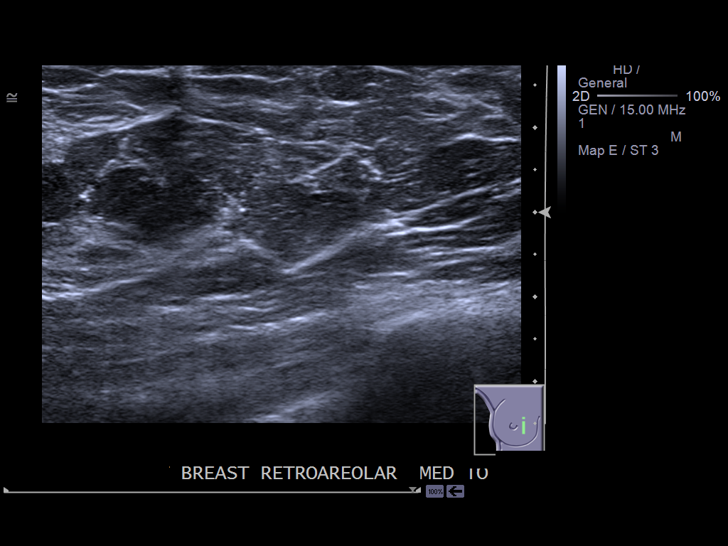
[im 8/21]
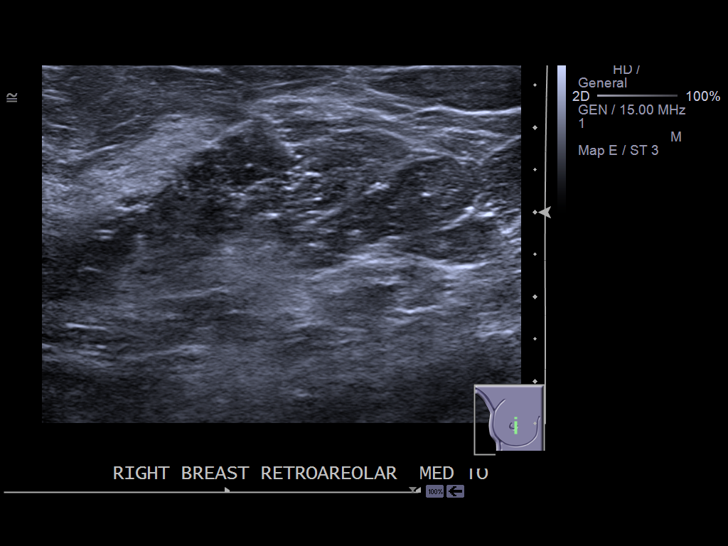
[im 9/21]
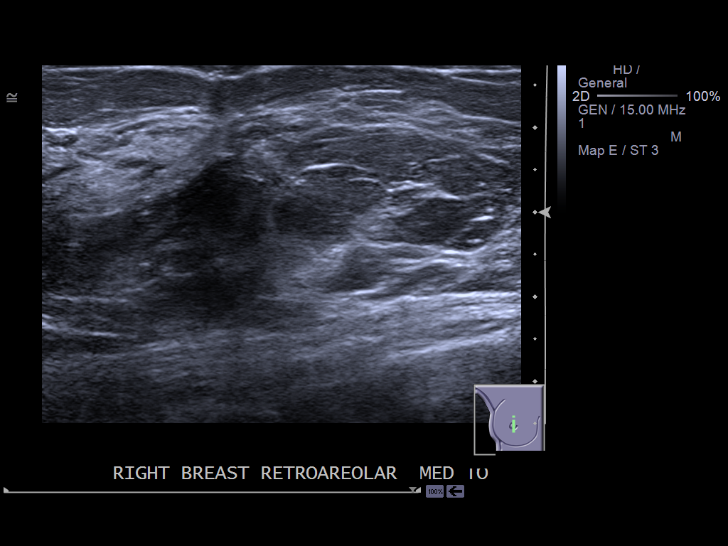
[im 11/21]
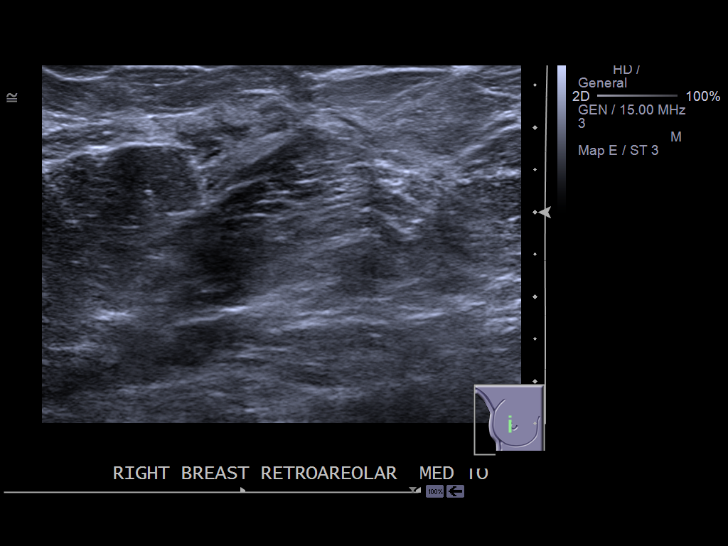
[im 13/21]
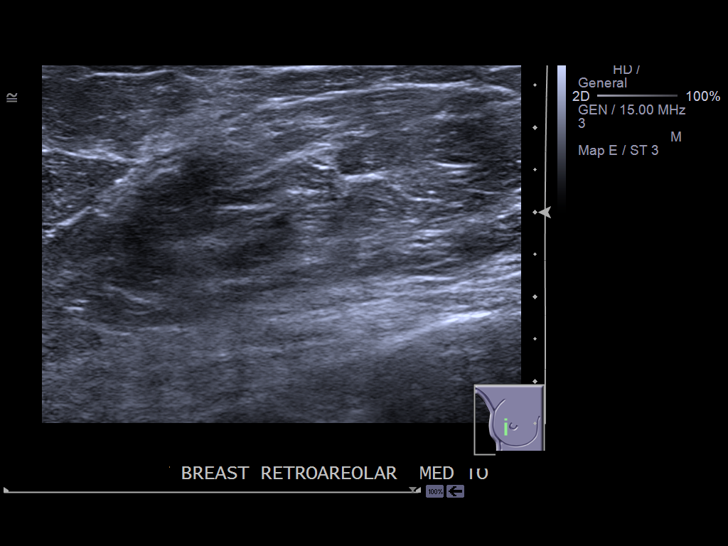
[im 14/21]
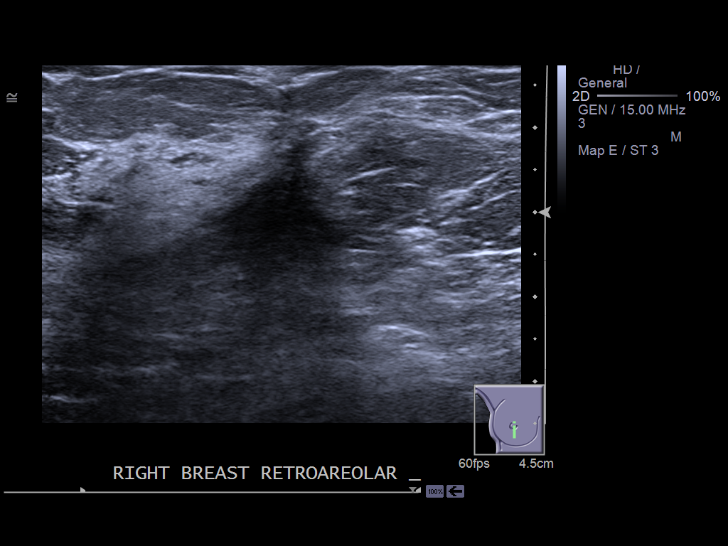
[im 16/21]
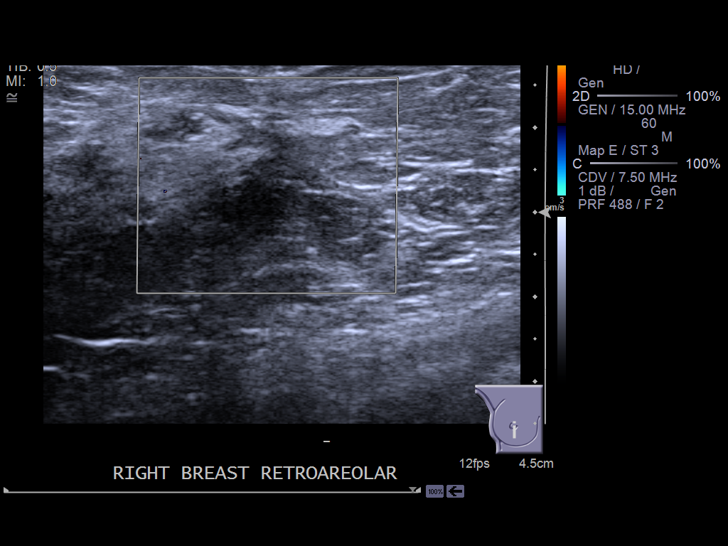
[im 17/21]
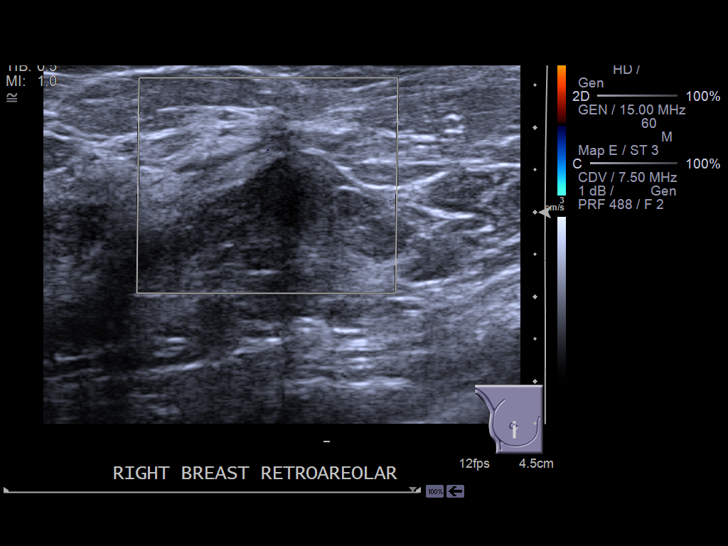
[im 19/21]
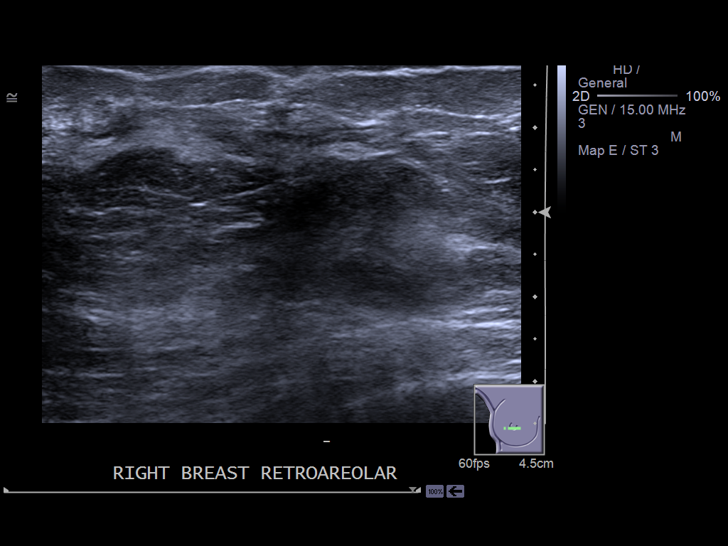
[im 21/21]
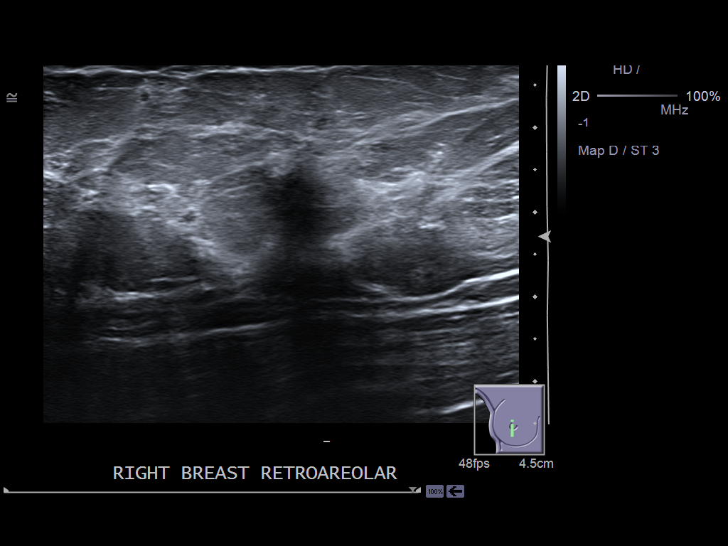

[13 of 21 positions shown; findings below may reference images not displayed]

FINDINGS: Spot compression magnification views were performed to evaluate the findings
in the right breast on the initial mammograms. With the spot compression
magnification views, the findings persist and appear to represent a
multilobulated, small mass. Some of the borders are well circumscribed while
other borders are obscured by overlapping fibroglandular tissue. It measures
approximately 1.0 x 0.4 cm. This is located adjacent to the biopsy clip
marker, but this finding appears new from the prior studies. This is located
in the central aspect of the right breast.

Repeat CC view was performed to evaluate the findings in the medial aspect
of the right CC view on initial mammograms. With this view, this area of
tissue effaced and assumed the appearance of normal fibroglandular tissue
that was similar to the prior studies.

Real-time ultrasound was performed of the central aspect of the right breast
and just medial of midline. No mass was seen to correlate with the
mammographic findings. In the retroareolar aspect of the right breast, there
is some ill-defined architectural distortion and subtle shadowing which, by
report from the technologist, is beneath the site of the surgical scar. This
finding would be consistent with post operative changes.
IMPRESSION: 1.     BI-RADS: Category 4 - Suspicious Abnormality.
2.     Surgical consultation and tissue diagnosis are suggested for the
findings concerning for a small, multilobulated mass in the central aspect
of the right breast. As this was not seen by ultrasound, tissue diagnosis
could be attempted with Stereotactic Guided Biopsy.

## 2014-08-26 NOTE — Addendum Note (Signed)
Addendum  created 08/26/14 1449 by Iver Nestle, MD   Modules edited: Anesthesia Responsible Staff

## 2014-09-08 ENCOUNTER — Ambulatory Visit: Payer: Medicare Other | Admitting: Podiatry

## 2014-12-02 ENCOUNTER — Encounter: Payer: Self-pay | Admitting: *Deleted

## 2015-01-13 ENCOUNTER — Ambulatory Visit: Payer: Self-pay | Admitting: General Surgery

## 2015-02-08 ENCOUNTER — Other Ambulatory Visit: Payer: Self-pay | Admitting: General Surgery

## 2015-02-22 ENCOUNTER — Encounter: Payer: Self-pay | Admitting: General Surgery

## 2015-02-22 ENCOUNTER — Ambulatory Visit (INDEPENDENT_AMBULATORY_CARE_PROVIDER_SITE_OTHER): Payer: Medicare Other | Admitting: General Surgery

## 2015-02-22 VITALS — BP 120/74 | HR 74 | Resp 12 | Ht 68.0 in | Wt 169.0 lb

## 2015-02-22 DIAGNOSIS — C50911 Malignant neoplasm of unspecified site of right female breast: Secondary | ICD-10-CM

## 2015-02-22 DIAGNOSIS — C50919 Malignant neoplasm of unspecified site of unspecified female breast: Secondary | ICD-10-CM | POA: Insufficient documentation

## 2015-02-22 NOTE — Progress Notes (Signed)
Patient ID: Rebekah Lowery, female   DOB: 07/17/1933, 82 y.o.   MRN: [id number]  Chief Complaint  Patient presents with  . Follow-up    breast cancer    HPI Rebekah Lowery is a 82 y.o. female here today for her breast cancer follow up. Patient states she is doing well. No Problems with Tamoxifen.  HPI  Past Medical History  Diagnosis Date  . Anemia   . Stroke ([place]) 04/15/09  . Diabetes mellitus   . Hypertension   . Change in voice   . Family history of pancreatic cancer   . Family history of lung cancer   . MVP (mitral valve prolapse)   . PAD (peripheral artery disease) ([place])   . Cancer [facility])     left breast  . 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    Past Surgical History  Procedure Laterality Date  . Laparoscopic hysterectomy  1982  . Breast surgery  01/29/1990    mastectomy of left  . Breast surgery  09/12/2005    pasrtial mastectomy of right  . Breast surgery  2013    Right Mastectomy  . Colon surgery  2010  . Eye surgery    . Abdominal hysterectomy    . Mastectomy      BIL  . Tonsillectomy    . Wrist surgery    . Cataract extraction w/phaco Right 08/03/2014    Procedure: CATARACT EXTRACTION PHACO AND INTRAOCULAR LENS PLACEMENT ([facility]);  Surgeon: Estill Cotta, MD;  Location: [facility] ORS;  Service: Ophthalmology;  Laterality: Right;  [place]: 01:17 AP%: 24.0 CDE: 32.46  Fluid Lot# [id number]     Family History  Problem Relation Age of Onset  . Cancer Sister     breast  . Cancer Maternal Aunt     breast    Social History Social History  Substance Use Topics  . Smoking status: Never Smoker   . Smokeless tobacco: Never Used  . Alcohol Use: No    Allergies  Allergen Reactions  . Aspirin Swelling and Other (See Comments)    Swelling of lips, caused pain also.  . Citric Acid Swelling and Rash    Swelling all over the body.  . Codeine Itching,  Swelling and Rash    Swelling was all over the body.  Koren Bound Juice Swelling and Rash    Allergic to lemons in general.  . Monosodium Glutamate Other (See Comments)    Causes spike in bp, nausea, constipation, gi upset.  . Peanut-Containing Drug Products Nausea Only    Current Outpatient Prescriptions  Medication Sig Dispense Refill  . glipiZIDE (GLUCOTROL XL) 5 MG 24 hr tablet Take 5 mg by mouth daily with breakfast.    . lisinopril (PRINIVIL,ZESTRIL) 5 MG tablet Take 5 mg by mouth daily.  1  . lisinopril-hydrochlorothiazide (PRINZIDE,ZESTORETIC) 20-25 MG per tablet Take 1 tablet by mouth daily.    . tamoxifen (NOLVADEX) 20 MG tablet TAKE 1 TABLET BY MOUTH DAILY 90 tablet 1   No current facility-administered medications for this visit.    Review of Systems Review of Systems  Constitutional: Negative.   Respiratory: Negative.   Cardiovascular: Negative.     Blood pressure 120/74, pulse 74, resp. rate 12, height 5' 8"  (1.727 m), weight 169 lb (76.658 kg).  Physical Exam Physical Exam  Constitutional: She is oriented to person, place, and time. She appears well-developed and well-nourished.  Eyes: Conjunctivae are normal. No scleral icterus.  Neck: Neck supple.  Cardiovascular: Normal rate, regular rhythm and normal heart sounds.   Pulmonary/Chest: Effort normal and breath sounds normal.    Bilateral mastectomy site are clean and well healed.  Full shoulder range of motion bilaterally. No evidence of upper extremity edema.  Lymphadenopathy:    She has no cervical adenopathy.  Neurological: She is alert and oriented to person, place, and time.  Skin: Skin is warm and dry.      Assessment    Doing well now 3 years status post right mastectomy.  Good tolerance of tamoxifen. (Patient declined aromatase inhibitor).    Plan       Patient to return in one year breast cancer  PCP:  Rosario Jacks,   This information has been scribed by Gaspar Cola CMA.    Robert Bellow 02/22/2015, 3:01 PM

## 2015-02-22 NOTE — Patient Instructions (Signed)
Patient to return in one week. 

## 2015-06-03 ENCOUNTER — Other Ambulatory Visit: Payer: Self-pay | Admitting: Internal Medicine

## 2015-06-03 ENCOUNTER — Ambulatory Visit
Admission: RE | Admit: 2015-06-03 | Discharge: 2015-06-03 | Disposition: A | Discharge status: Home / Self Care | Payer: Medicare Other | Source: Ambulatory Visit | Attending: Internal Medicine | Admitting: Internal Medicine

## 2015-06-03 DIAGNOSIS — M79605 Pain in left leg: Secondary | ICD-10-CM | POA: Insufficient documentation

## 2015-06-03 DIAGNOSIS — M5136 Other intervertebral disc degeneration, lumbar region: Secondary | ICD-10-CM | POA: Insufficient documentation

## 2015-09-04 DIAGNOSIS — I10 Essential (primary) hypertension: Secondary | ICD-10-CM | POA: Insufficient documentation

## 2015-09-04 DIAGNOSIS — E119 Type 2 diabetes mellitus without complications: Secondary | ICD-10-CM | POA: Diagnosis not present

## 2015-09-04 DIAGNOSIS — Z853 Personal history of malignant neoplasm of breast: Secondary | ICD-10-CM | POA: Diagnosis not present

## 2015-09-04 DIAGNOSIS — R51 Headache: Secondary | ICD-10-CM | POA: Diagnosis present

## 2015-09-04 DIAGNOSIS — Z79899 Other long term (current) drug therapy: Secondary | ICD-10-CM | POA: Diagnosis not present

## 2015-09-04 NOTE — ED Triage Notes (Addendum)
Pt reports to ED w/ c/o elevated BP.  Pt sts that she is allergic to MSG and suspects that the chinese food she ate today had MSG.  Pt sts that she took her BP twice today and both times, highest being 195/107.  Pt sts that she has previously been on BP meds but was taken off them by PCP approx 1 year ago.  Pt A/Ox4, resp even and unlabored.  Denies CP, SOB, LOC, weakness, and n/v/d.

## 2015-09-05 ENCOUNTER — Emergency Department
Admission: EM | Admit: 2015-09-05 | Discharge: 2015-09-05 | Disposition: A | Discharge status: Home / Self Care | Payer: Medicare Other | Attending: Emergency Medicine | Admitting: Emergency Medicine

## 2015-09-05 DIAGNOSIS — I10 Essential (primary) hypertension: Secondary | ICD-10-CM

## 2015-09-05 LAB — CBC
HCT: 34.4 % — ABNORMAL LOW (ref 35.0–47.0)
Hemoglobin: 11.7 g/dL — ABNORMAL LOW (ref 12.0–16.0)
MCH: 27.1 pg (ref 26.0–34.0)
MCHC: 34 g/dL (ref 32.0–36.0)
MCV: 79.7 fL — ABNORMAL LOW (ref 80.0–100.0)
Platelets: 208 10*3/uL (ref 150–440)
RBC: 4.32 MIL/uL (ref 3.80–5.20)
RDW: 14.5 % (ref 11.5–14.5)
WBC: 8.2 10*3/uL (ref 3.6–11.0)

## 2015-09-05 LAB — BASIC METABOLIC PANEL
Anion gap: 3 — ABNORMAL LOW (ref 5–15)
BUN: 16 mg/dL (ref 6–20)
CO2: 28 mmol/L (ref 22–32)
Calcium: 8.6 mg/dL — ABNORMAL LOW (ref 8.9–10.3)
Chloride: 105 mmol/L (ref 101–111)
Creatinine, Ser: 0.88 mg/dL (ref 0.44–1.00)
GFR calc Af Amer: >60 mL/min (ref >60)
GFR calc non Af Amer: >60 mL/min (ref >60)
Glucose, Bld: 205 mg/dL — ABNORMAL HIGH (ref 65–99)
Potassium: 4 mmol/L (ref 3.5–5.1)
Sodium: 136 mmol/L (ref 135–145)

## 2015-09-05 LAB — TROPONIN I: Troponin I: <0.03 ng/mL (ref <0.03)

## 2015-09-05 MED ORDER — DEXAMETHASONE 4 MG PO TABS
4.0000 mg | ORAL_TABLET | Freq: Once | ORAL | Status: AC
  Administered 2015-09-05: 4 mg via ORAL
  Filled 2015-09-05: qty 1

## 2015-09-05 NOTE — ED Notes (Signed)
Pt. States she checked her BP tonight due to HA.  Pt. States BP went as high as 195/107.  Pt. States she was taken off BP medication about 1 year ago.

## 2015-09-05 NOTE — ED Provider Notes (Signed)
[facility] Emergency Department Provider Note  Time seen: 2:13 AM  I have reviewed the triage vital signs and the nursing notes.   HISTORY  Chief Complaint Hypertension    HPI Rebekah Lowery is a 82 y.o. female with a past medical history of diabetes, hypertension, CVA, who presents the emergency department for an elevated blood pressure. According to the patient she was having a mild headache last night and so she took her blood pressure nose was 180, she retook it later in the evening and was 195 so she came to the emergency department for evaluation. States she was taking blood pressure medications but her doctor took her off his medications approximately one year ago. She states she has had an episode of blood pressure spiking high before due to ingesting MSG in her food, per patient. She states she ate fish and is not sure if that had MSG in it.  Patient states continued mild headache in the emergency department, denies chest pain shortness of breath, weakness or numbness.  Past Medical History:  Diagnosis Date  . Anemia   . Cancer ([place])    left breast  . Change in voice   . Diabetes mellitus   . Family history of lung cancer   . Family history of pancreatic cancer   . Hypertension   . Malignant neoplasm of breast (female), unspecified site 12/11/2011   Right, 6 mm mucinous carcinoma,T1b,N0. Are 90%, PR 65%, HER-2/neu not amplified.  . MVP (mitral valve prolapse)   . PAD (peripheral artery disease) ([place])   . Stroke [facility]) 04/15/09    Patient Active Problem List   Diagnosis Date Noted  . Stage I breast cancer ([place]) 02/22/2015  . History of breast cancer in female 09/29/2010    Past Surgical History:  Procedure Laterality Date  . ABDOMINAL HYSTERECTOMY    . BREAST SURGERY  01/29/1990   mastectomy of left  . BREAST SURGERY  09/12/2005   pasrtial mastectomy of right  . BREAST SURGERY  2013   Right Mastectomy  . CATARACT EXTRACTION W/PHACO Right  08/03/2014   Procedure: CATARACT EXTRACTION PHACO AND INTRAOCULAR LENS PLACEMENT (IOC);  Surgeon: Estill Cotta, MD;  Location: [facility] ORS;  Service: Ophthalmology;  Laterality: Right;  [place]: 01:17 AP%: 24.0 CDE: 32.46  Fluid Lot# [id number]   . COLON SURGERY  2010  . EYE SURGERY    . LAPAROSCOPIC HYSTERECTOMY  1982  . MASTECTOMY     BIL  . TONSILLECTOMY    . WRIST SURGERY      Prior to Admission medications   Medication Sig Start Date End Date Taking? Authorizing Provider  glipiZIDE (GLUCOTROL XL) 5 MG 24 hr tablet Take 5 mg by mouth daily with breakfast.    Historical Provider, MD  lisinopril (PRINIVIL,ZESTRIL) 5 MG tablet Take 5 mg by mouth daily. 03/12/14   Historical Provider, MD  lisinopril-hydrochlorothiazide (PRINZIDE,ZESTORETIC) 20-25 MG per tablet Take 1 tablet by mouth daily.    Historical Provider, MD  tamoxifen (NOLVADEX) 20 MG tablet TAKE 1 TABLET BY MOUTH DAILY 02/09/15   Robert Bellow, MD    Allergies  Allergen Reactions  . Aspirin Swelling and Other (See Comments)    Swelling of lips, caused pain also.  . Citric Acid Swelling and Rash    Swelling all over the body.  . Codeine Itching, Swelling and Rash    Swelling was all over the body.  Koren Bound Juice Swelling and Rash    Allergic to lemons  in general.  . Monosodium Glutamate Other (See Comments)    Causes spike in bp, nausea, constipation, gi upset.  . Peanut-Containing Drug Products Nausea Only    Family History  Problem Relation Age of Onset  . Cancer Sister     breast  . Cancer Maternal Aunt     breast    Social History Social History  Substance Use Topics  . Smoking status: Never Smoker  . Smokeless tobacco: Never Used  . Alcohol use No    Review of Systems Constitutional: Negative for fever. Cardiovascular: Negative for chest pain. Respiratory: Negative for shortness of breath. Gastrointestinal: Negative for abdominal pain Musculoskeletal: Negative for back pain. Neurological: Mild  headache. Denies focal weakness or numbness. 10-point ROS otherwise negative.  ____________________________________________   PHYSICAL EXAM:  VITAL SIGNS: ED Triage Vitals  Enc Vitals Group     BP 09/04/15 2315 (!) 210/89     Pulse Rate 09/04/15 2315 67     Resp --      Temp 09/04/15 2315 98.1 F (36.7 C)     Temp Source 09/04/15 2315 Oral     SpO2 --      Weight 09/04/15 2315 165 lb (74.8 kg)     Height 09/04/15 2315 5' 8"  (1.727 m)     Head Circumference --      Peak Flow --      Pain Score 09/04/15 2325 8     Pain Loc --      Pain Edu? --      Excl. in [place]? --     Constitutional: Alert and oriented. Well appearing and in no distress. Eyes: Normal exam ENT   Head: Normocephalic and atraumatic.   Mouth/Throat: Mucous membranes are moist. Cardiovascular: Normal rate, regular rhythm. No murmur Respiratory: Normal respiratory effort without tachypnea nor retractions. Breath sounds are clear  Gastrointestinal: Soft and nontender. No distention.  Musculoskeletal: Nontender with normal range of motion in all extremities.  Neurologic:  Normal speech and language. No gross focal neurologic deficits Skin:  Skin is warm, dry and intact.  Psychiatric: Mood and affect are normal.  ____________________________________________    EKG  EKG reviewed and interpreted by myself shows normal sinus rhythm at 60 bpm, narrow QRS, normal axis, normal intervals, no concerning ST changes.  INITIAL IMPRESSION / ASSESSMENT AND PLAN / ED COURSE  Pertinent labs & imaging results that were available during my care of the patient were reviewed by me and considered in my medical decision making (see chart for details).  The patient presents the emergency department with an elevated blood pressure. Patient's blood pressure is elevated 210/89 in the emergency department. We will check labs, EKG, and closely monitor in the emergency department. Patient is agreeable to plan. Overall the patient  appears well, no distress with a normal physical examination.  EKG is reassuring, labs are within normal limits. Patient's blood pressure currently 148/67. 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 States the headache has resolved. Patient is last time she required a course of steroids. We will dose a one-time dose of Decadron in the emergency department, the patient will be discharged home with PCP follow-up this week. Patient agreeable to plan.  ____________________________________________   FINAL CLINICAL IMPRESSION(S) / ED DIAGNOSES  Hypertension    Harvest Dark, MD 09/05/15 [phone]

## 2015-10-27 NOTE — Addendum Note (Signed)
Addendum  created 10/27/15 1310 by Courtney Paris, CRNA   Delete clinical note

## 2015-11-15 ENCOUNTER — Other Ambulatory Visit: Payer: Self-pay | Admitting: General Surgery

## 2016-02-22 ENCOUNTER — Ambulatory Visit (INDEPENDENT_AMBULATORY_CARE_PROVIDER_SITE_OTHER): Payer: Medicare Other | Admitting: General Surgery

## 2016-02-22 ENCOUNTER — Encounter: Payer: Self-pay | Admitting: General Surgery

## 2016-02-22 VITALS — BP 142/70 | HR 64 | Resp 12 | Ht 68.0 in | Wt 167.0 lb

## 2016-02-22 DIAGNOSIS — C50911 Malignant neoplasm of unspecified site of right female breast: Secondary | ICD-10-CM | POA: Diagnosis not present

## 2016-02-22 NOTE — Progress Notes (Signed)
Patient ID: Rebekah Lowery, female   DOB: 11-Dec-1933, 83 y.o.   MRN: [id number]  Chief Complaint  Patient presents with  . Follow-up    HPI Rebekah Lowery is a 83 y.o. female. Here today for her annual follow up breast cancer, with a history of bilateral mastectomy. She is tolerating the tamoxifen. She is having left leg pain since May 2017, she did get a cortisone injection and is using meloxicam. The pain occurs occasionally. She did have an orthopedic consult and he recommended hip surgery. She is here today with her husband, Winferd Humphrey, she remarried her first husband.  HPI  Past Medical History:  Diagnosis Date  . Anemia   . Cancer ([place])    left breast  . Change in voice   . Diabetes mellitus   . Family history of lung cancer   . Family history of pancreatic cancer   . Hypertension   . 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  . MVP (mitral valve prolapse)   . PAD (peripheral artery disease) ([place])   . Stroke [facility]) 04/15/09    Past Surgical History:  Procedure Laterality Date  . ABDOMINAL HYSTERECTOMY    . BREAST SURGERY  01/29/1990   mastectomy of left  . BREAST SURGERY  09/12/2005   pasrtial mastectomy of right  . BREAST SURGERY  2013   Right Mastectomy  . CATARACT EXTRACTION W/PHACO Right 08/03/2014   Procedure: CATARACT EXTRACTION PHACO AND INTRAOCULAR LENS PLACEMENT (IOC);  Surgeon: Estill Cotta, MD;  Location: [facility] ORS;  Service: Ophthalmology;  Laterality: Right;  [place]: 01:17 AP%: 24.0 CDE: 32.46  Fluid Lot# [id number]   . COLONOSCOPY  2010  . EYE SURGERY    . LAPAROSCOPIC HYSTERECTOMY  1982  . MASTECTOMY     BIL  . TONSILLECTOMY    . WRIST SURGERY      Family History  Problem Relation Age of Onset  . Cancer Sister     breast  . Cancer Maternal Aunt     breast    Social History Social History  Substance Use Topics  . Smoking status:  Never Smoker  . Smokeless tobacco: Never Used  . Alcohol use No    Allergies  Allergen Reactions  . Aspirin Swelling and Other (See Comments)    Swelling of lips, caused pain also.  . Citric Acid Swelling and Rash    Swelling all over the body.  . Codeine Itching, Swelling and Rash    Swelling was all over the body.  Koren Bound Juice Swelling and Rash    Allergic to lemons in general.  . Monosodium Glutamate Other (See Comments)    Causes spike in bp, nausea, constipation, gi upset.  . Peanut-Containing Drug Products Nausea Only    Current Outpatient Prescriptions  Medication Sig Dispense Refill  . glipiZIDE (GLUCOTROL XL) 5 MG 24 hr tablet Take 5 mg by mouth daily with breakfast.    . lisinopril (PRINIVIL,ZESTRIL) 5 MG tablet Take 5 mg by mouth daily.  1  . lisinopril-hydrochlorothiazide (PRINZIDE,ZESTORETIC) 20-25 MG per tablet Take 1 tablet by mouth daily.    . meloxicam (MOBIC) 15 MG tablet TAKE 1 TABLET BY MOUTH EVERY DAY WITH MEALS  3  . tamoxifen (NOLVADEX) 20 MG tablet TAKE 1 TABLET BY MOUTH DAILY 90 tablet 1   No current facility-administered medications for this visit.     Review of Systems Review of  Systems  Constitutional: Negative.   Respiratory: Negative.   Cardiovascular: Negative.     Blood pressure (!) 142/70, pulse 64, resp. rate 12, height 5' 8"  (1.727 m), weight 167 lb (75.8 kg).  Physical Exam Physical Exam  Constitutional: She is oriented to person, place, and time. She appears well-developed and well-nourished.  HENT:  Mouth/Throat: Oropharynx is clear and moist.  Eyes: Conjunctivae are normal. No scleral icterus.  Neck: Neck supple.  Cardiovascular: Normal rate, regular rhythm and normal heart sounds.   Pulmonary/Chest: Effort normal and breath sounds normal.    Musculoskeletal:       Legs: Lymphadenopathy:    She has no cervical adenopathy.  Neurological: She is alert and oriented to person, place, and time.  Skin: Skin is warm and dry.   Psychiatric: Her behavior is normal.       Assessment    Doing well now 4 years out from management of her right breast cancer.     Plan    Continue tamoxifen.  Consider Breast Cancer Index testing next year for continue estrogen suppresion.      Follow up in one year with an office visit. The patient is aware to call back for any questions or new concerns.   This information has been scribed by Karie Fetch RN, BSN,BC.    Robert Bellow 02/22/2016, 9:17 PM

## 2016-02-22 NOTE — Patient Instructions (Signed)
The patient is aware to call back for any questions or concerns.  

## 2016-06-09 ENCOUNTER — Encounter: Payer: Self-pay | Admitting: Emergency Medicine

## 2016-06-09 ENCOUNTER — Emergency Department
Admission: EM | Admit: 2016-06-09 | Discharge: 2016-06-10 | Disposition: A | Discharge status: Home / Self Care | Payer: Medicare Other | Attending: Emergency Medicine | Admitting: Emergency Medicine

## 2016-06-09 ENCOUNTER — Emergency Department: Payer: Medicare Other

## 2016-06-09 DIAGNOSIS — S60222A Contusion of left hand, initial encounter: Secondary | ICD-10-CM | POA: Diagnosis not present

## 2016-06-09 DIAGNOSIS — Y999 Unspecified external cause status: Secondary | ICD-10-CM | POA: Diagnosis not present

## 2016-06-09 DIAGNOSIS — Z853 Personal history of malignant neoplasm of breast: Secondary | ICD-10-CM | POA: Diagnosis not present

## 2016-06-09 DIAGNOSIS — Z8673 Personal history of transient ischemic attack (TIA), and cerebral infarction without residual deficits: Secondary | ICD-10-CM | POA: Insufficient documentation

## 2016-06-09 DIAGNOSIS — S6992XA Unspecified injury of left wrist, hand and finger(s), initial encounter: Secondary | ICD-10-CM | POA: Diagnosis present

## 2016-06-09 DIAGNOSIS — Y939 Activity, unspecified: Secondary | ICD-10-CM | POA: Diagnosis not present

## 2016-06-09 DIAGNOSIS — Z91010 Allergy to peanuts: Secondary | ICD-10-CM | POA: Insufficient documentation

## 2016-06-09 DIAGNOSIS — W01190A Fall on same level from slipping, tripping and stumbling with subsequent striking against furniture, initial encounter: Secondary | ICD-10-CM | POA: Diagnosis not present

## 2016-06-09 DIAGNOSIS — Z7984 Long term (current) use of oral hypoglycemic drugs: Secondary | ICD-10-CM | POA: Insufficient documentation

## 2016-06-09 DIAGNOSIS — I10 Essential (primary) hypertension: Secondary | ICD-10-CM | POA: Diagnosis not present

## 2016-06-09 DIAGNOSIS — Y929 Unspecified place or not applicable: Secondary | ICD-10-CM | POA: Insufficient documentation

## 2016-06-09 DIAGNOSIS — Z79899 Other long term (current) drug therapy: Secondary | ICD-10-CM | POA: Insufficient documentation

## 2016-06-09 DIAGNOSIS — E119 Type 2 diabetes mellitus without complications: Secondary | ICD-10-CM | POA: Diagnosis not present

## 2016-06-09 NOTE — ED Triage Notes (Signed)
Patient states that she fell out of a chair. Patient denies hitting her head. Patient with complaint of left wrist pain.

## 2016-06-10 ENCOUNTER — Emergency Department: Payer: Medicare Other

## 2016-06-10 MED ORDER — ACETAMINOPHEN 500 MG PO TABS
1000.0000 mg | ORAL_TABLET | ORAL | Status: AC
  Administered 2016-06-10: 1000 mg via ORAL
  Filled 2016-06-10: qty 2

## 2016-06-10 NOTE — Discharge Instructions (Signed)
Please follow-up with your regular doctor in about one week for reevaluation of your left hand and evaluation for splint removal.

## 2016-06-10 NOTE — ED Provider Notes (Signed)
[facility] Emergency Department Provider Note   ____________________________________________   First MD Initiated Contact with Patient 06/09/16 2356     (approximate)  I have reviewed the triage vital signs and the nursing notes.   HISTORY  Chief Complaint Left hand pain   HPI Rebekah Lowery is a 83 y.o. female this evening, patient was getting up from a chair when she stumbled. She fell striking her left hand on a chair or the ground. She did not loose consciousness. Reports she simply tripped, and fell and supported herself with her left hand outstretched. She denies pain in the wrist to me, but instead she points at her left hand and states she is having pain around the bottom of her left thumb  No numbness or weakness. No nausea or vomiting. No trouble using Handleman feels sore and achy, currently about a 3-4 out of 10 in intensity and worsened by movement of the left thumb  No deformity, patient does report slight amount of bruising  Did not strike her head. Denies any other injuries. No loss of consciousness chest pain or trouble breathing   Past Medical History:  Diagnosis Date  . Anemia   . Cancer ([place])    left breast  . Change in voice   . Diabetes mellitus   . Family history of lung cancer   . Family history of pancreatic cancer   . Hypertension   . 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  . MVP (mitral valve prolapse)   . PAD (peripheral artery disease) ([place])   . Stroke [facility]) 04/15/09    Patient Active Problem List   Diagnosis Date Noted  . Stage I breast cancer ([place]) 02/22/2015  . History of breast cancer in female 09/29/2010    Past Surgical History:  Procedure Laterality Date  . ABDOMINAL HYSTERECTOMY    . BREAST SURGERY  01/29/1990   mastectomy of left  . BREAST SURGERY  09/12/2005   pasrtial mastectomy of  right  . BREAST SURGERY  2013   Right Mastectomy  . CATARACT EXTRACTION W/PHACO Right 08/03/2014   Procedure: CATARACT EXTRACTION PHACO AND INTRAOCULAR LENS PLACEMENT (IOC);  Surgeon: Estill Cotta, MD;  Location: [facility] ORS;  Service: Ophthalmology;  Laterality: Right;  [place]: 01:17 AP%: 24.0 CDE: 32.46  Fluid Lot# [id number]   . COLONOSCOPY  2010  . EYE SURGERY    . LAPAROSCOPIC HYSTERECTOMY  1982  . MASTECTOMY     BIL  . TONSILLECTOMY    . WRIST SURGERY      Prior to Admission medications   Medication Sig Start Date End Date Taking? Authorizing Provider  glipiZIDE (GLUCOTROL XL) 5 MG 24 hr tablet Take 5 mg by mouth daily with breakfast.    [provider]  lisinopril (PRINIVIL,ZESTRIL) 5 MG tablet Take 5 mg by mouth daily. 03/12/14   [provider]  lisinopril-hydrochlorothiazide (PRINZIDE,ZESTORETIC) 20-25 MG per tablet Take 1 tablet by mouth daily.    [provider]  meloxicam (MOBIC) 15 MG tablet TAKE 1 TABLET BY MOUTH EVERY DAY WITH MEALS 02/04/16   [provider]  tamoxifen (NOLVADEX) 20 MG tablet TAKE 1 TABLET BY MOUTH DAILY 11/16/15   Robert Bellow, MD    Allergies Aspirin; Citric acid; Codeine; Lemon juice; Monosodium glutamate; and Peanut-containing drug products  Family History  Problem Relation Age of Onset  . Cancer Sister  breast  . Cancer Maternal Aunt        breast    Social History Social History  Substance Use Topics  . Smoking status: Never Smoker  . Smokeless tobacco: Never Used  . Alcohol use No    Review of Systems Constitutional: No fever/chills Eyes: No visual changes. ENT: No sore throat. Cardiovascular: Denies chest pain. Respiratory: Denies shortness of breath. Gastrointestinal: No abdominal pain.  No nausea, no vomiting.  No diarrhea.  No constipation. Genitourinary: Negative for dysuria. Musculoskeletal: Negative for back pain. Skin: Negative for rash. Neurological: Negative for headaches,  focal weakness or numbness.    ____________________________________________   PHYSICAL EXAM:  VITAL SIGNS: ED Triage Vitals  Enc Vitals Group     BP 06/09/16 2139 (!) 173/77     Pulse Rate 06/09/16 2138 74     Resp 06/09/16 2138 18     Temp 06/09/16 2138 98.4 F (36.9 C)     Temp Source 06/09/16 2138 Oral     SpO2 06/09/16 2138 98 %     Weight 06/09/16 2139 175 lb (79.4 kg)     Height 06/09/16 2139 _0  (1.727 m)     Head Circumference --      Peak Flow --      Pain Score 06/09/16 2138 9     Pain Loc --      Pain Edu? --      Excl. in [place]? --     Constitutional: Alert and oriented. Well appearing and in no acute distress. Eyes: Conjunctivae are normal. Head: Atraumatic. Nose: No congestion/rhinnorhea. Mouth/Throat: Mucous membranes are moist. Neck: No stridor.  No cervical tenderness  Cardiovascular: Normal rate, regular rhythm. Grossly normal heart sounds.  Good peripheral circulation. Respiratory: Normal respiratory effort.  No retractions. Lungs CTAB. Gastrointestinal: Soft and nontender. No distention. Musculoskeletal: No lower extremity tenderness nor edema. Lower Extremities  No edema. Normal DP/PT pulses bilateral with good cap refill.  Normal neuro-motor function lower extremities bilateral.  RIGHT Right lower extremity demonstrates normal strength, good use of all muscles. No edema bruising or contusions of the right hip, right knee, right ankle. Full range of motion of the right lower extremity without pain. No pain on axial loading. No evidence of trauma.  LEFT Left lower extremity demonstrates normal strength, good use of all muscles. No edema bruising or contusions of the hip,  knee, ankle. Full range of motion of the left lower extremity without pain. No pain on axial loading. No evidence of trauma.  RIGHT Right upper extremity demonstrates normal strength, good use of all muscles. No edema bruising or contusions of the right shoulder/upper arm, right  elbow, right forearm / hand. Full range of motion of the right right upper extremity without pain. No evidence of trauma. Strong radial pulse. Intact median/ulnar/radial neuro-muscular exam.  LEFT Left upper extremity demonstrates normal strength, good use of all muscles. No edema bruising or contusions of the left shoulder/upper arm, left elbow, left forearm. Full range of motion of the left  upper extremity without pain except some discomfort in the region of the thenar eminence to palpation with slight amount of bruising noted over the palmar surface of the left hand.  There is mild tenderness across the snuffbox left scaphoid region without deformity. Strong radial pulse. Intact median/ulnar/radial neuro-muscular exam.  Neurologic:  Normal speech and language. No gross focal neurologic deficits are appreciated.  Skin:  Skin is warm, dry and intact. No rash noted. Psychiatric: Mood and affect are  normal. Speech and behavior are normal.  ____________________________________________   LABS (all labs ordered are listed, but only abnormal results are displayed)  Labs Reviewed - No data to display ____________________________________________  EKG   ____________________________________________  RADIOLOGY  Dg Wrist Complete Left  Result Date: 06/09/2016 CLINICAL DATA:  Left wrist pain after fall out of chair. EXAM: LEFT WRIST - COMPLETE 3+ VIEW COMPARISON:  Radiographs 03/20/2007 FINDINGS: There is no acute fracture or dislocation. Previous distal radius fracture is healed. There is no evidence of arthropathy or other focal bone abnormality. Soft tissues are unremarkable. IMPRESSION: No acute fracture or subluxation of the left wrist. Electronically Signed   By: Jeb Levering M.D.   On: 06/09/2016 22:01   Dg Hand Complete Left  Result Date: 06/10/2016 CLINICAL DATA:  Left hand pain over the thenar eminence after fall. EXAM: LEFT HAND - COMPLETE 3+ VIEW COMPARISON:  Wrist radiographs  yesterday FINDINGS: There is no evidence of fracture or dislocation. There is no evidence of arthropathy or other focal bone abnormality. Soft tissues are unremarkable. IMPRESSION: Negative radiographs of the left hand. Electronically Signed   By: Jeb Levering M.D.   On: 06/10/2016 01:13    ____________________________________________   PROCEDURES  Procedure(s) performed: None  Procedures  Critical Care performed: No  ____________________________________________   INITIAL IMPRESSION / ASSESSMENT AND PLAN / ED COURSE  Pertinent labs & imaging results that were available during my care of the patient were reviewed by me and considered in my medical decision making (see chart for details).    Mechanical fall. Reports left hand injury. No obvious deformity, but does have tenderness in the region of the scaphoid.  No obvious evidence of a scaphoid fracture, and tenderness over the thenar eminence is somewhat minimal making my pretest probability for scaphoid fracture low. X-rays are negative for fracture, and the patient's exam is reassuring with no vascular or neurologic or muscular deficits.  Denies and shows no evidence of any other injury from the fall. Reports it was mechanical in nature without any preceding or concerning symptomatology.  ----------------------------------------- 1:19 AM on 06/10/2016 -----------------------------------------   X-ray results reviewed with patient. We'll place patient in a left thumb spica, advise patient on careful return precautions and she will be following up with her primary doctor in about one week for reevaluation of splint removal as I spoke to her that I cannot exclude a small injury to the "scaphoid" bone given where her pain is at and thus we need to splint it until she can have further evaluation in about a week.  Return precautions and treatment recommendations and follow-up discussed with the patient who is agreeable with the  plan.       ____________________________________________   FINAL CLINICAL IMPRESSION(S) / ED DIAGNOSES  Final diagnoses:  Contusion of left hand, initial encounter      NEW MEDICATIONS STARTED DURING THIS VISIT:  New Prescriptions   No medications on file     Note:  This document was prepared using Dragon voice recognition software and may include unintentional dictation errors.     Delman Kitten, MD 06/10/16 [phone]

## 2016-06-19 ENCOUNTER — Other Ambulatory Visit: Payer: Self-pay | Admitting: General Surgery

## 2016-12-14 ENCOUNTER — Encounter: Payer: Self-pay | Admitting: General Surgery

## 2017-02-22 ENCOUNTER — Ambulatory Visit (INDEPENDENT_AMBULATORY_CARE_PROVIDER_SITE_OTHER): Payer: Medicare Other | Admitting: General Surgery

## 2017-02-22 ENCOUNTER — Encounter: Payer: Self-pay | Admitting: General Surgery

## 2017-02-22 VITALS — BP 144/89 | HR 70 | Resp 16 | Ht 68.0 in | Wt 167.0 lb

## 2017-02-22 DIAGNOSIS — C50911 Malignant neoplasm of unspecified site of right female breast: Secondary | ICD-10-CM

## 2017-02-22 IMAGING — CT CT ABD-PELV W/ CM
1 of 3 series · 14 of 32 positions shown, 19 images · IV contrast (omnipaque)
Comparison: None.

CLINICAL DATA: Lower abdominal pain

EXAM:
CT ABDOMEN AND PELVIS WITH CONTRAST
TECHNIQUE: Multidetector CT imaging of the abdomen and pelvis was performed
using the standard protocol following bolus administration of
intravenous contrast.
CONTRAST:  100 cc Omnipaque 300

[Series 2: routine abd pel with · axial · 0.73mm/px · z∈[-492,-37]mm · 14 of 103 slices shown, 19 images]
[im 6/103  soft-tissue]
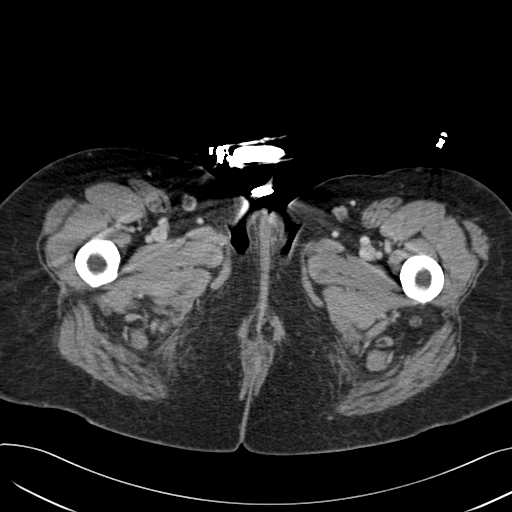
[im 6/103  bone]
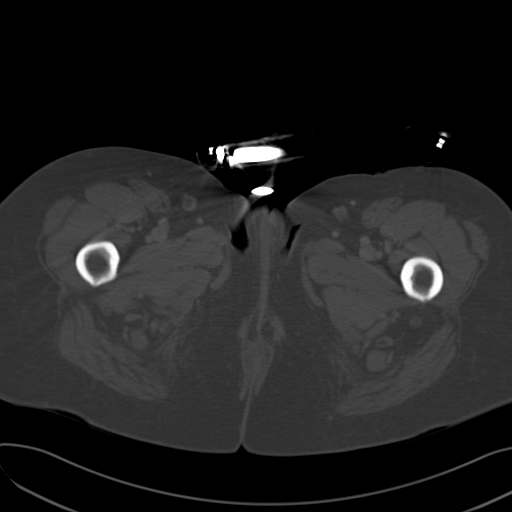
[im 17/103  soft-tissue]
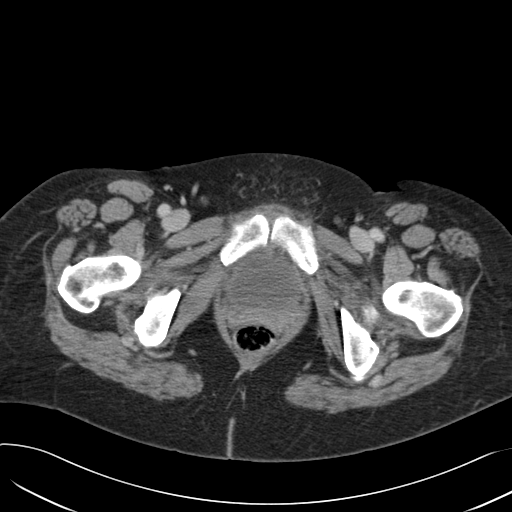
[im 22/103  soft-tissue]
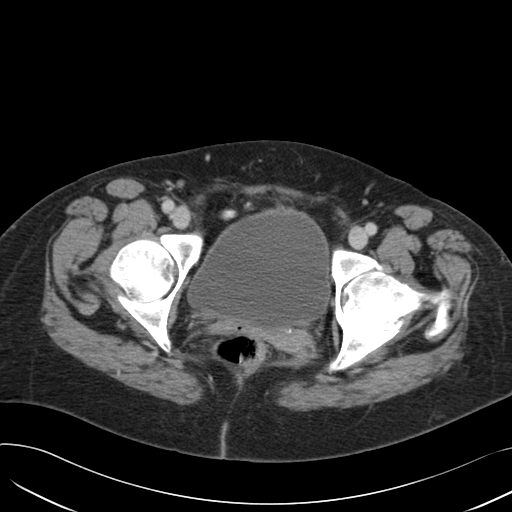
[im 27/103  soft-tissue]
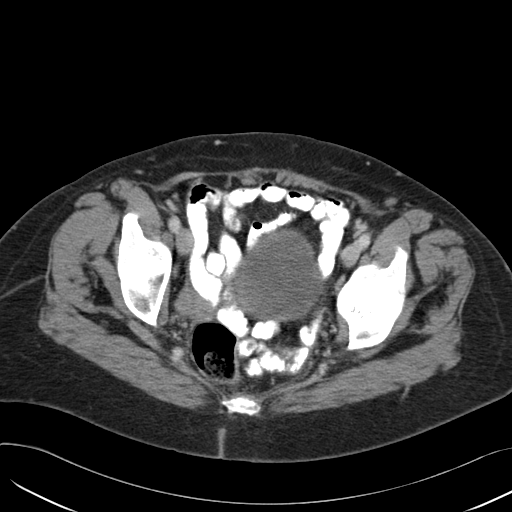
[im 38/103  soft-tissue]
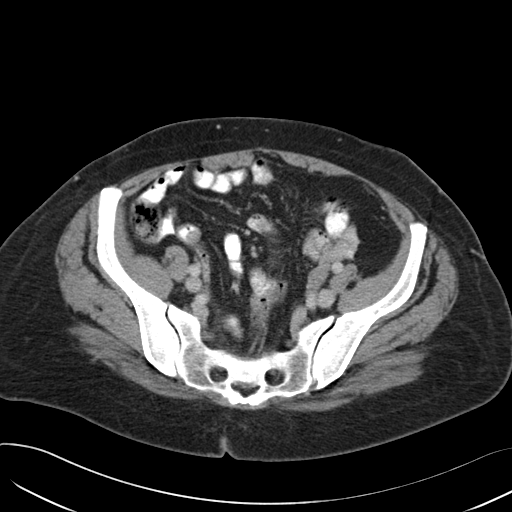
[im 43/103  soft-tissue]
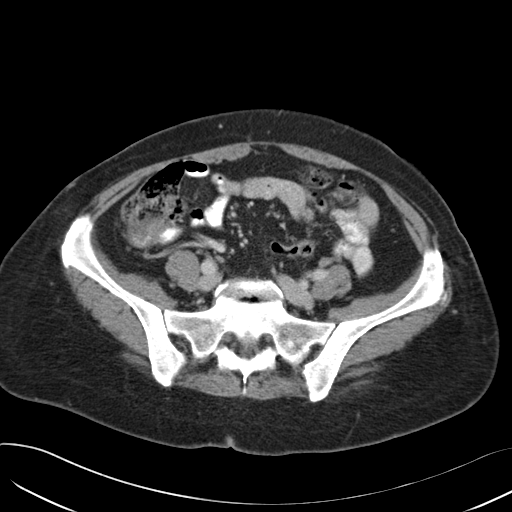
[im 54/103  soft-tissue]
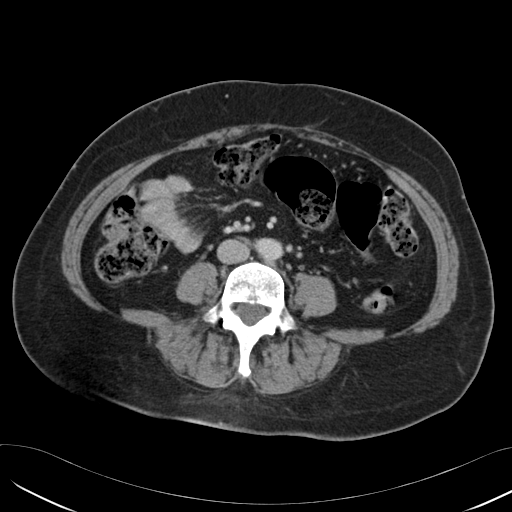
[im 60/103  soft-tissue]
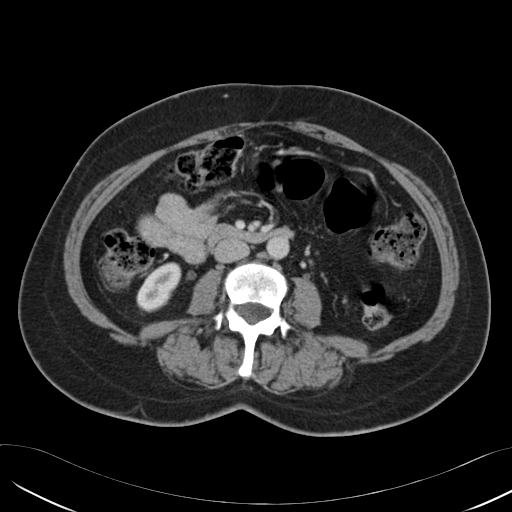
[im 65/103  soft-tissue]
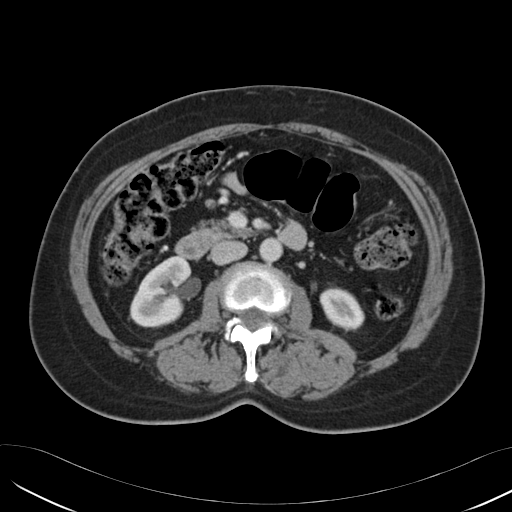
[im 65/103  bone]
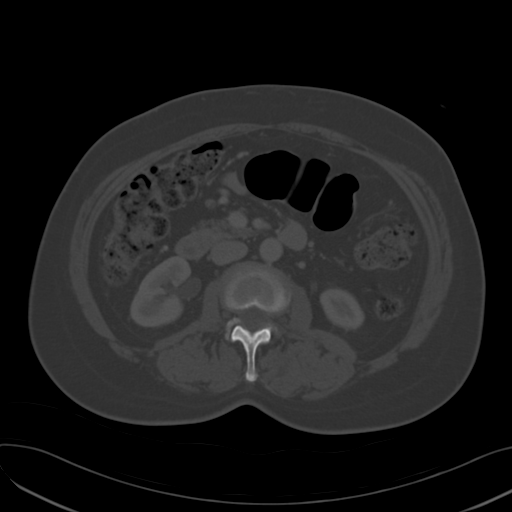
[im 76/103  soft-tissue]
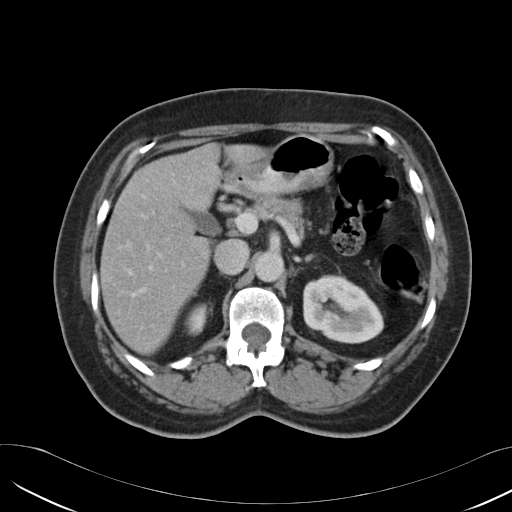
[im 81/103  soft-tissue]
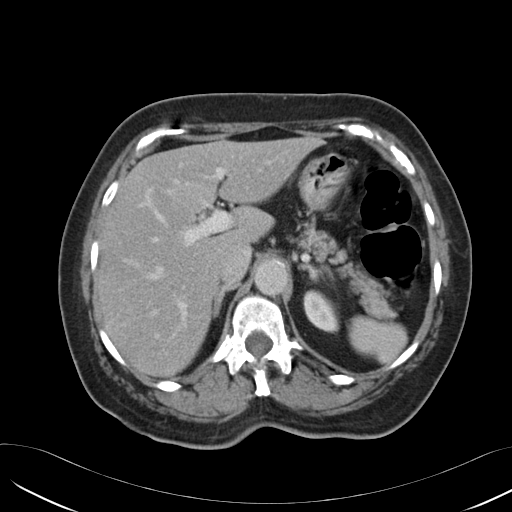
[im 81/103  lung]
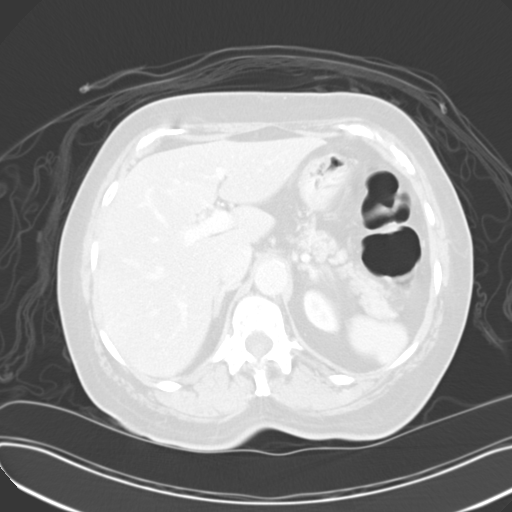
[im 86/103  soft-tissue]
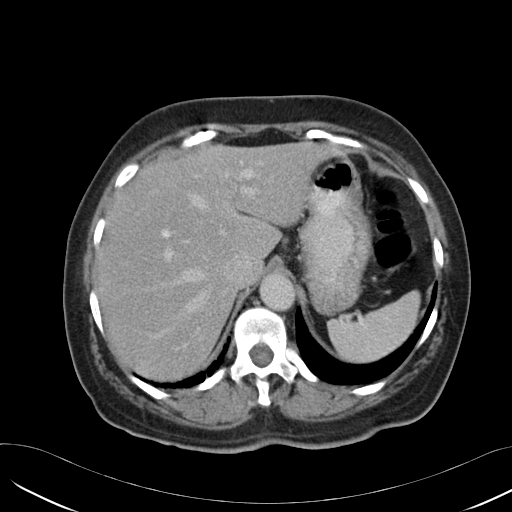
[im 86/103  lung]
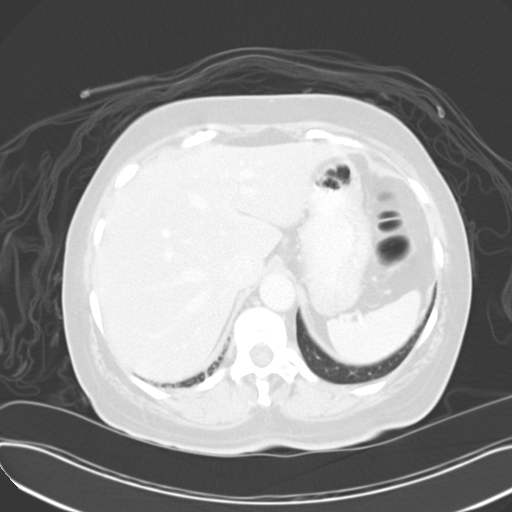
[im 92/103  lung]
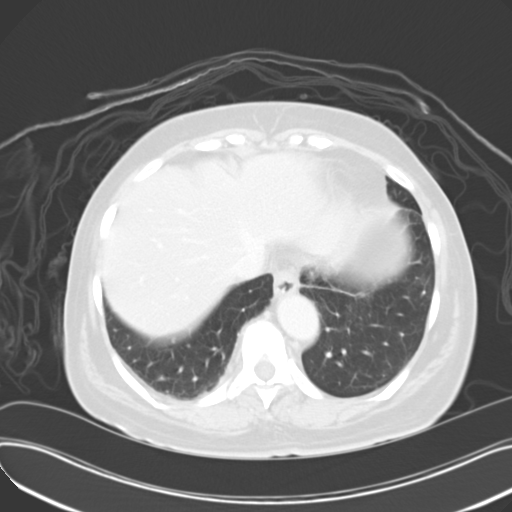
[im 97/103  soft-tissue]
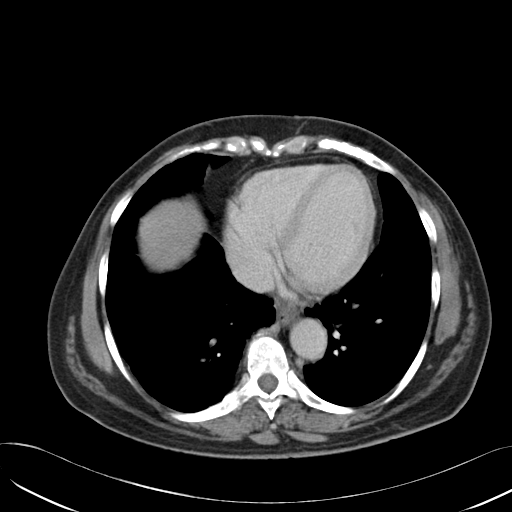
[im 97/103  lung]
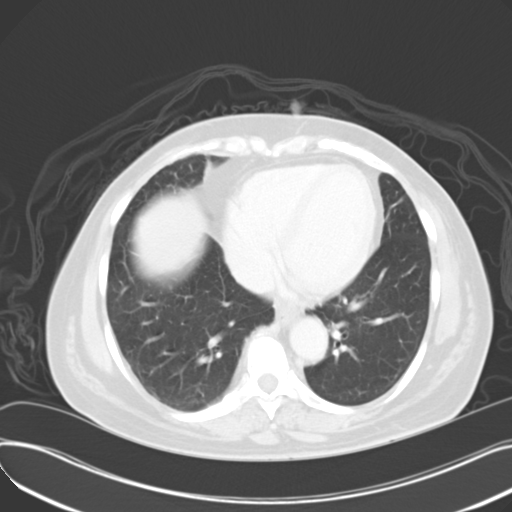

[14 of 32 positions shown; findings below may reference images not displayed]

FINDINGS: Normal heart size. Trace pericardial fluid. Small hiatal hernia.
Linear opacity within the lingula and right lung base, favored to
reflect atelectasis or scarring.

Hepatic steatosis with areas of relative sparing along the
gallbladder fossa. No radiodense gallstones or biliary ductal
dilatation. No appreciable abnormality of the spleen, pancreas,
adrenal glands. Lobular renal contours. No hydroureteronephrosis.

No overt colitis. Normal appendix. Small bowel loops are of normal
course and caliber. Small fat containing umbilical hernia. No free
intraperitoneal air or fluid. No lymphadenopathy.

Normal caliber aorta and branch vessels with mild scattered
atherosclerotic disease.

Thin walled bladder. Absent uterus. No adnexal mass. Ovaries are
present.

Severe left hip osteoarthritis with mild associated acetabular
protrusion. Multilevel degenerative changes. L5-S1 broad-based disc
bulge results in mild central canal narrowing.
IMPRESSION: No acute abdominopelvic process identified by CT.

Hepatic steatosis.

## 2017-02-22 NOTE — Progress Notes (Signed)
Patient ID: Rebekah Lowery, female   DOB: Apr 12, 1933, 84 y.o.   MRN: [id number]  Chief Complaint  Patient presents with  . Breast Cancer    HPI Rebekah Lowery is a 84 y.o. female here today for her one year follow up breast cancer.Patient finished her 5-year plan treatment with tamoxifen at the end of September 2018.    Marland KitchenHPI  Past Medical History:  Diagnosis Date  . Anemia   . Cancer ([place])    left breast  . Change in voice   . Diabetes mellitus   . Family history of lung cancer   . Family history of pancreatic cancer   . Hypertension   . 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  . MVP (mitral valve prolapse)   . PAD (peripheral artery disease) ([place])   . Stroke [facility]) 04/15/09    Past Surgical History:  Procedure Laterality Date  . ABDOMINAL HYSTERECTOMY    . BREAST SURGERY  01/29/1990   mastectomy of left  . BREAST SURGERY  09/12/2005   partial mastectomy of right  . BREAST SURGERY  2013   Right Mastectomy  . CATARACT EXTRACTION W/PHACO Right 08/03/2014   Procedure: CATARACT EXTRACTION PHACO AND INTRAOCULAR LENS PLACEMENT (IOC);  Surgeon: Estill Cotta, MD;  Location: [facility] ORS;  Service: Ophthalmology;  Laterality: Right;  [place]: 01:17 AP%: 24.0 CDE: 32.46  Fluid Lot# [id number]   . COLONOSCOPY  2010  . EYE SURGERY    . LAPAROSCOPIC HYSTERECTOMY  1982  . MASTECTOMY     BIL  . TONSILLECTOMY    . WRIST SURGERY      Family History  Problem Relation Age of Onset  . Cancer Sister        breast  . Cancer Maternal Aunt        breast    Social History Social History   Tobacco Use  . Smoking status: Never Smoker  . Smokeless tobacco: Never Used  Substance Use Topics  . Alcohol use: No    Alcohol/week: 0.0 oz  . Drug use: No    Allergies  Allergen Reactions  . Aspirin Swelling and Other (See Comments)    Swelling of lips, caused pain also.  .  Citric Acid Swelling and Rash    Swelling all over the body.  . Codeine Itching, Swelling and Rash    Swelling was all over the body.  Koren Bound Juice Swelling and Rash    Allergic to lemons in general.  . Monosodium Glutamate Other (See Comments)    Causes spike in bp, nausea, constipation, gi upset.  . Peanut-Containing Drug Products Nausea Only    Current Outpatient Medications  Medication Sig Dispense Refill  . glipiZIDE (GLUCOTROL XL) 5 MG 24 hr tablet Take 5 mg by mouth daily with breakfast.    . lisinopril (PRINIVIL,ZESTRIL) 5 MG tablet Take 5 mg by mouth daily.  1   No current facility-administered medications for this visit.     Review of Systems Review of Systems  Constitutional: Negative.   Respiratory: Negative.   Cardiovascular: Negative.     Blood pressure (!) 144/89, pulse 70, resp. rate 16, height 5' 8"  (1.727 m), weight 167 lb (75.8 kg).  Physical Exam Physical Exam  Constitutional: She is oriented to person, place, and time. She appears well-developed and well-nourished.  Eyes: Conjunctivae are normal. No scleral icterus.  Neck: Neck supple.  Cardiovascular: Normal  rate, regular rhythm and normal heart sounds.  Pulmonary/Chest: Effort normal and breath sounds normal.    Bilateral  mastectomy sites well healed.   Lymphadenopathy:    She has no cervical adenopathy.    She has axillary adenopathy.       Right: No supraclavicular adenopathy present.       Left: No supraclavicular adenopathy present.  Neurological: She is alert and oriented to person, place, and time.  Skin: Skin is warm and dry.    Data Reviewed Original tumor on the right breast, 6 mm mucinous carcinoma.  ER/PR positive.  Assessment    No evidence of recurrent disease.    Plan  Patient to return in one year. The patient is aware to call back for any questions or concerns.   HPI, Physical Exam, Assessment and Plan have been scribed under the direction and in the presence of  Hervey Ard, MD.  Gaspar Cola, CMA\  I have completed the exam and reviewed the above documentation for accuracy and completeness.  I agree with the above.  Haematologist has been used and any errors in dictation or transcription are unintentional.  Hervey Ard, M.D., F.A.C.S.  Rebekah Lowery 02/22/2017, 11:30 AM

## 2017-02-22 NOTE — Patient Instructions (Signed)
Patient to return in one year. The patient is aware to call back for any questions or concerns.

## 2017-03-30 ENCOUNTER — Encounter: Payer: Self-pay | Admitting: Emergency Medicine

## 2017-03-30 ENCOUNTER — Emergency Department: Payer: Medicare Other

## 2017-03-30 ENCOUNTER — Emergency Department
Admission: EM | Admit: 2017-03-30 | Discharge: 2017-03-30 | Disposition: A | Discharge status: Home / Self Care | Payer: Medicare Other | Attending: Emergency Medicine | Admitting: Emergency Medicine

## 2017-03-30 DIAGNOSIS — R519 Headache, unspecified: Secondary | ICD-10-CM

## 2017-03-30 DIAGNOSIS — Z8673 Personal history of transient ischemic attack (TIA), and cerebral infarction without residual deficits: Secondary | ICD-10-CM | POA: Insufficient documentation

## 2017-03-30 DIAGNOSIS — Z7984 Long term (current) use of oral hypoglycemic drugs: Secondary | ICD-10-CM | POA: Insufficient documentation

## 2017-03-30 DIAGNOSIS — E119 Type 2 diabetes mellitus without complications: Secondary | ICD-10-CM | POA: Diagnosis not present

## 2017-03-30 DIAGNOSIS — R51 Headache: Secondary | ICD-10-CM | POA: Diagnosis not present

## 2017-03-30 DIAGNOSIS — Z853 Personal history of malignant neoplasm of breast: Secondary | ICD-10-CM | POA: Diagnosis not present

## 2017-03-30 DIAGNOSIS — I10 Essential (primary) hypertension: Secondary | ICD-10-CM | POA: Diagnosis not present

## 2017-03-30 LAB — COMPREHENSIVE METABOLIC PANEL
ALT: 16 U/L (ref 14–54)
AST: 17 U/L (ref 15–41)
Albumin: 3.9 g/dL (ref 3.5–5.0)
Alkaline Phosphatase: 65 U/L (ref 38–126)
Anion gap: 10 (ref 5–15)
BUN: 19 mg/dL (ref 6–20)
CO2: 25 mmol/L (ref 22–32)
Calcium: 8.9 mg/dL (ref 8.9–10.3)
Chloride: 99 mmol/L — ABNORMAL LOW (ref 101–111)
Creatinine, Ser: 1 mg/dL (ref 0.44–1.00)
GFR calc Af Amer: 59 mL/min — ABNORMAL LOW (ref >60)
GFR calc non Af Amer: 51 mL/min — ABNORMAL LOW (ref >60)
Glucose, Bld: 208 mg/dL — ABNORMAL HIGH (ref 65–99)
Potassium: 4 mmol/L (ref 3.5–5.1)
Sodium: 134 mmol/L — ABNORMAL LOW (ref 135–145)
Total Bilirubin: 0.6 mg/dL (ref 0.3–1.2)
Total Protein: 7.6 g/dL (ref 6.5–8.1)

## 2017-03-30 LAB — APTT: aPTT: 24 seconds (ref 24–36)

## 2017-03-30 LAB — DIFFERENTIAL
Basophils Absolute: 0.1 10*3/uL (ref 0–0.1)
Basophils Relative: 1 %
Eosinophils Absolute: 0.2 10*3/uL (ref 0–0.7)
Eosinophils Relative: 2 %
Lymphocytes Relative: 31 %
Lymphs Abs: 2.3 10*3/uL (ref 1.0–3.6)
Monocytes Absolute: 0.5 10*3/uL (ref 0.2–0.9)
Monocytes Relative: 7 %
Neutro Abs: 4.4 10*3/uL (ref 1.4–6.5)
Neutrophils Relative %: 59 %

## 2017-03-30 LAB — CBC
HCT: 36.3 % (ref 35.0–47.0)
Hemoglobin: 12 g/dL (ref 12.0–16.0)
MCH: 25.8 pg — ABNORMAL LOW (ref 26.0–34.0)
MCHC: 33 g/dL (ref 32.0–36.0)
MCV: 78.1 fL — ABNORMAL LOW (ref 80.0–100.0)
Platelets: 197 10*3/uL (ref 150–440)
RBC: 4.65 MIL/uL (ref 3.80–5.20)
RDW: 14.4 % (ref 11.5–14.5)
WBC: 7.5 10*3/uL (ref 3.6–11.0)

## 2017-03-30 LAB — TROPONIN I: Troponin I: <0.03 ng/mL (ref <0.03)

## 2017-03-30 LAB — PROTIME-INR
INR: 0.87
Prothrombin Time: 11.8 seconds (ref 11.4–15.2)

## 2017-03-30 MED ORDER — ACETAMINOPHEN 500 MG PO TABS
1000.0000 mg | ORAL_TABLET | Freq: Once | ORAL | Status: AC
  Administered 2017-03-30: 1000 mg via ORAL
  Filled 2017-03-30: qty 2

## 2017-03-30 MED ORDER — SODIUM CHLORIDE 0.9 % IV BOLUS
1000.0000 mL | Freq: Once | INTRAVENOUS | Status: AC
  Administered 2017-03-30: 1000 mL via INTRAVENOUS

## 2017-03-30 MED ORDER — DIPHENHYDRAMINE HCL 50 MG/ML IJ SOLN
25.0000 mg | Freq: Once | INTRAMUSCULAR | Status: AC
  Administered 2017-03-30: 25 mg via INTRAVENOUS
  Filled 2017-03-30: qty 1

## 2017-03-30 MED ORDER — METOCLOPRAMIDE HCL 5 MG/ML IJ SOLN
10.0000 mg | Freq: Once | INTRAMUSCULAR | Status: AC
  Administered 2017-03-30: 10 mg via INTRAVENOUS
  Filled 2017-03-30: qty 2

## 2017-03-30 NOTE — ED Triage Notes (Addendum)
Patient with complaint of headache to left side of her head that started this morning. Patient states that the pain is intermittent.  Patient states that it "feels funny".  Patient states that she has a history of a stroke and that she had a headache with her prevous stroke.

## 2017-03-30 NOTE — Discharge Instructions (Signed)
Please follow-up with your doctor in the next several days for recheck/reevaluation.  Return to the emergency department for any worsening headache, confusion, slurred speech, weakness or numbness of any arm or leg, or any other symptom personally concerning to yourself.

## 2017-03-30 NOTE — ED Provider Notes (Signed)
[facility] Emergency Department Provider Note  Time seen: 8:45 PM  I have reviewed the triage vital signs and the nursing notes.   HISTORY  Chief Complaint Headache    HPI Rebekah Lowery is a 84 y.o. female with a past medical history of diabetes, hypertension, CVA, presents to the emergency department the headache.  According to the patient since last night she has had a headache which she describes as mild to moderate in the left side of her head.  Patient denies any weakness, numbness, slurred speech or confusion.  Denies any fever, cough or congestion.  Largely negative review of systems otherwise.  Patient states she is only taken a Claritin for the headache at home but denies any relief.  Patient states she is concerned because in 2011 she had a headache that was then followed by slurred speech and confusion also he diagnosed with a stroke.  Denies any slurred speech confusion or neurological deficits at this time.  Denies any deficits from her prior stroke in 2011.  Overall the patient appears very well, no distress, only complaint is moderate left-sided headache.   Past Medical History:  Diagnosis Date  . Anemia   . Cancer ([place])    left breast  . Change in voice   . Diabetes mellitus   . Family history of lung cancer   . Family history of pancreatic cancer   . Hypertension   . 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  . MVP (mitral valve prolapse)   . PAD (peripheral artery disease) ([place])   . Stroke [facility]) 04/15/09    Patient Active Problem List   Diagnosis Date Noted  . Stage I breast cancer, right ([place]) 02/22/2015  . History of breast cancer in female 09/29/2010    Past Surgical History:  Procedure Laterality Date  . ABDOMINAL HYSTERECTOMY    . BREAST SURGERY  01/29/1990   mastectomy of left  . BREAST SURGERY  09/12/2005   partial  mastectomy of right  . BREAST SURGERY  2013   Right Mastectomy  . CATARACT EXTRACTION W/PHACO Right 08/03/2014   Procedure: CATARACT EXTRACTION PHACO AND INTRAOCULAR LENS PLACEMENT (IOC);  Surgeon: Estill Cotta, MD;  Location: [facility] ORS;  Service: Ophthalmology;  Laterality: Right;  [place]: 01:17 AP%: 24.0 CDE: 32.46  Fluid Lot# [id number]   . COLONOSCOPY  2010  . EYE SURGERY    . LAPAROSCOPIC HYSTERECTOMY  1982  . MASTECTOMY     BIL  . TONSILLECTOMY    . WRIST SURGERY      Prior to Admission medications   Medication Sig Start Date End Date Taking? Authorizing Provider  glipiZIDE (GLUCOTROL XL) 5 MG 24 hr tablet Take 5 mg by mouth daily with breakfast.    [provider]  lisinopril (PRINIVIL,ZESTRIL) 5 MG tablet Take 5 mg by mouth daily. 03/12/14   [provider]    Allergies  Allergen Reactions  . Aspirin Swelling and Other (See Comments)    Swelling of lips, caused pain also.  . Citric Acid Swelling and Rash    Swelling all over the body.  . Codeine Itching, Swelling and Rash    Swelling was all over the body.  Koren Bound Juice Swelling and Rash    Allergic to lemons in general.  . Monosodium Glutamate Other (See Comments)    Causes spike in bp, nausea, constipation, gi upset.  Marland Kitchen  Peanut-Containing Drug Products Nausea Only    Family History  Problem Relation Age of Onset  . Cancer Sister        breast  . Cancer Maternal Aunt        breast    Social History Social History   Tobacco Use  . Smoking status: Never Smoker  . Smokeless tobacco: Never Used  Substance Use Topics  . Alcohol use: No    Alcohol/week: 0.0 oz  . Drug use: No    Review of Systems Constitutional: Negative for fever. Eyes: Negative for visual complaints ENT: Negative for recent illness/congestion Cardiovascular: Negative for chest pain. Respiratory: Negative for shortness of breath. Gastrointestinal: Negative for abdominal pain, vomiting and diarrhea. Genitourinary:  Negative for urinary compaints Musculoskeletal: Negative for musculoskeletal complaints Skin: Negative for skin complaints  Neurological: Moderate left headache.  Denies focal weakness or numbness slurred speech or confusion. All other ROS negative  ____________________________________________   PHYSICAL EXAM:  VITAL SIGNS: ED Triage Vitals  Enc Vitals Group     BP 03/30/17 1905 (!) 164/78     Pulse Rate 03/30/17 1905 76     Resp 03/30/17 1905 18     Temp 03/30/17 1905 98.3 F (36.8 C)     Temp Source 03/30/17 1905 Oral     SpO2 03/30/17 1905 97 %     Weight 03/30/17 1906 163 lb (73.9 kg)     Height 03/30/17 1906 5' 8"  (1.727 m)     Head Circumference --      Peak Flow --      Pain Score 03/30/17 1905 0     Pain Loc --      Pain Edu? --      Excl. in [place]? --    Constitutional: Alert and oriented. Well appearing and in no distress. Eyes: Normal exam ENT   Head: Normocephalic and atraumatic.  Normal tympanic membranes.  No rhinorrhea.   Mouth/Throat: Mucous membranes are moist. Cardiovascular: Normal rate, regular rhythm. No murmur Respiratory: Normal respiratory effort without tachypnea nor retractions. Breath sounds are clear  Gastrointestinal: Soft and nontender. No distention.  Musculoskeletal: Nontender with normal range of motion in all extremities.  Neurologic:  Normal speech and language. No gross focal neurologic deficits.  Equal grip strengths.  No pronator drift.  5/5 motor in all extremities.  No lower extremity drift.  Cranial nerves intact.   Skin:  Skin is warm, dry and intact.  Psychiatric: Mood and affect are normal. Speech and behavior are normal.   ____________________________________________    EKG  EKG reviewed and interpreted by myself shows normal sinus rhythm at 72 bpm with a narrow QRS, normal axis, normal intervals, no concerning ST changes.  ____________________________________________    RADIOLOGY  CT negative for acute  abnormality.  ____________________________________________   INITIAL IMPRESSION / ASSESSMENT AND PLAN / ED COURSE  Pertinent labs & imaging results that were available during my care of the patient were reviewed by me and considered in my medical decision making (see chart for details).  Resents to the emergency department for headache since yesterday.  Differential would include headache, migraine headache, sinus headache, tension headache, less likely CVA.  Overall the patient appears extremely well, normal neurological exam, no concerning findings on exam or history at this time.  CT scan of the head is negative, labs are normal.  Patient is only taken a Claritin at home to treat the headache.  We will dose Tylenol, Reglan fluids and Benadryl for symptom relief  and continue to closely monitor.  If the patient's symptoms/headache improves I believe the patient will be safe for discharge home with PCP follow-up.  Patient agreeable to this plan of care.  I provided return precautions for any strokelike symptoms.   Patient care signed out to Dr. Mariea Clonts, for headache reassessment. ____________________________________________   FINAL CLINICAL IMPRESSION(S) / ED DIAGNOSES  Headache    Harvest Dark, MD 03/30/17 2050

## 2017-03-30 NOTE — ED Notes (Signed)
Pt reported that she started having HA last night. States that time from time she has a "funny HA" and sharp pains shoot through her head. Pt is alert and oriented x 4. Pt has Hx of stroke back in 2011.

## 2018-02-26 ENCOUNTER — Ambulatory Visit (INDEPENDENT_AMBULATORY_CARE_PROVIDER_SITE_OTHER): Payer: Medicare Other | Admitting: General Surgery

## 2018-02-26 ENCOUNTER — Encounter: Payer: Self-pay | Admitting: General Surgery

## 2018-02-26 VITALS — BP 162/79 | HR 66 | Temp 98.4°F | Ht 68.0 in | Wt 170.0 lb

## 2018-02-26 DIAGNOSIS — C50911 Malignant neoplasm of unspecified site of right female breast: Secondary | ICD-10-CM

## 2018-02-26 NOTE — Patient Instructions (Signed)
Return in one year. The patient is aware to call back for any questions or concerns.  

## 2018-02-26 NOTE — Progress Notes (Signed)
Patient ID: Rebekah Lowery, female   DOB: 1933/08/10, 85 y.o.   MRN: [id number]  Chief Complaint  Patient presents with  . Follow-up    Rebekah Lowery is a 85 y.o. female here today for her one year follow up breast cancer check up. Patient states she is doing well. Having surgery left hip 03/08/2018.  Rebekah  Past Medical History:  Diagnosis Date  . Anemia   . Cancer ([place])    left breast  . Change in voice   . Diabetes mellitus   . Family history of lung cancer   . Family history of pancreatic cancer   . Hypertension   . 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  . MVP (mitral valve prolapse)   . PAD (peripheral artery disease) ([place])   . Stroke [facility]) 04/15/09    Past Surgical History:  Procedure Laterality Date  . ABDOMINAL HYSTERECTOMY    . BREAST SURGERY  01/29/1990   mastectomy of left  . BREAST SURGERY  09/12/2005   partial mastectomy of right  . BREAST SURGERY  2013   Right Mastectomy  . CATARACT EXTRACTION W/PHACO Right 08/03/2014   Procedure: CATARACT EXTRACTION PHACO AND INTRAOCULAR LENS PLACEMENT (IOC);  Surgeon: Estill Cotta, MD;  Location: [facility] ORS;  Service: Ophthalmology;  Laterality: Right;  [place]: 01:17 AP%: 24.0 CDE: 32.46  Fluid Lot# [id number]   . COLONOSCOPY  2010  . EYE SURGERY    . LAPAROSCOPIC HYSTERECTOMY  1982  . MASTECTOMY     BIL  . TONSILLECTOMY    . WRIST SURGERY      Family History  Problem Relation Age of Onset  . Cancer Sister        breast  . Cancer Maternal Aunt        breast    Social History Social History   Tobacco Use  . Smoking status: Never Smoker  . Smokeless tobacco: Never Used  Substance Use Topics  . Alcohol use: No    Alcohol/week: 0.0 standard drinks  . Drug use: No    Allergies  Allergen Reactions  . Aspirin Swelling and Other (See Comments)    Swelling of lips, caused pain also.  . Citric  Acid Swelling and Rash    Swelling all over the body.  . Codeine Itching, Swelling and Rash    Swelling was all over the body.  Koren Bound Juice Swelling and Rash    Allergic to lemons in general.  . Monosodium Glutamate Other (See Comments)    Causes spike in bp, nausea, constipation, gi upset.  . Peanut-Containing Drug Products Nausea Only    Current Outpatient Medications  Medication Sig Dispense Refill  . glipiZIDE (GLUCOTROL XL) 5 MG 24 hr tablet Take 5 mg by mouth daily with breakfast.    . lisinopril (PRINIVIL,ZESTRIL) 5 MG tablet Take 5 mg by mouth daily.  1   No current facility-administered medications for this visit.     Review of Systems Review of Systems  Constitutional: Negative.   Respiratory: Negative.   Cardiovascular: Negative.     Blood pressure (!) 162/79, pulse 66, temperature 98.4 F (36.9 C), temperature source Skin, height 5' 8" (1.727 m), weight 170 lb (77.1 kg), SpO2 97 %.  Physical Exam Physical Exam Exam conducted with a chaperone present.  Constitutional:      Appearance: She is well-developed.  Eyes:     General:  No scleral icterus.    Conjunctiva/sclera: Conjunctivae normal.  Neck:     Musculoskeletal: Neck supple.  Cardiovascular:     Rate and Rhythm: Normal rate and regular rhythm.     Heart sounds: Normal heart sounds.  Pulmonary:     Effort: Pulmonary effort is normal.     Breath sounds: Normal breath sounds.  Chest:    Lymphadenopathy:     Cervical: No cervical adenopathy.  Skin:    General: Skin is warm and dry.  Neurological:     Mental Status: She is alert and oriented to person, place, and time.      Assessment No evidence of recurrent cancer.  Plan  Return in one year. The patient is aware to call back for any questions or concerns.  A prescription for bilateral breast prostheses and surgical bras was provided. Rebekah, Physical Exam, Assessment and Plan have been scribed under the direction and in the presence of  Hervey Ard, MD.  Gaspar Cola, CMA  I have completed the exam and reviewed the above documentation for accuracy and completeness.  I agree with the above.  Haematologist has been used and any errors in dictation or transcription are unintentional.  Hervey Ard, M.D., F.A.C.S.  Forest Gleason Dicy Smigel 02/27/2018, 2:05 PM

## 2018-07-30 ENCOUNTER — Encounter: Payer: Self-pay | Admitting: General Surgery

## 2018-11-05 ENCOUNTER — Other Ambulatory Visit: Payer: Self-pay

## 2018-11-05 DIAGNOSIS — Z20822 Contact with and (suspected) exposure to covid-19: Secondary | ICD-10-CM

## 2018-11-06 LAB — NOVEL CORONAVIRUS, NAA: SARS-CoV-2, NAA: NOT DETECTED

## 2018-12-12 ENCOUNTER — Other Ambulatory Visit: Payer: Self-pay | Admitting: Internal Medicine

## 2018-12-12 DIAGNOSIS — T65891A Toxic effect of other specified substances, accidental (unintentional), initial encounter: Secondary | ICD-10-CM

## 2019-01-27 ENCOUNTER — Other Ambulatory Visit: Admission: RE | Admit: 2019-01-27 | Payer: Medicare Other | Source: Ambulatory Visit

## 2019-01-28 ENCOUNTER — Ambulatory Visit: Payer: Medicare Other

## 2019-02-26 ENCOUNTER — Ambulatory Visit: Payer: Self-pay | Admitting: Surgery

## 2019-04-30 ENCOUNTER — Encounter: Payer: Self-pay | Admitting: *Deleted

## 2019-08-29 ENCOUNTER — Ambulatory Visit (INDEPENDENT_AMBULATORY_CARE_PROVIDER_SITE_OTHER): Payer: Medicare Other | Admitting: Vascular Surgery

## 2019-08-29 ENCOUNTER — Other Ambulatory Visit: Payer: Self-pay

## 2019-08-29 ENCOUNTER — Encounter (INDEPENDENT_AMBULATORY_CARE_PROVIDER_SITE_OTHER): Payer: Self-pay | Admitting: Vascular Surgery

## 2019-08-29 VITALS — BP 190/80 | HR 70 | Ht 68.0 in | Wt 173.0 lb

## 2019-08-29 DIAGNOSIS — I10 Essential (primary) hypertension: Secondary | ICD-10-CM | POA: Diagnosis not present

## 2019-08-29 DIAGNOSIS — M7989 Other specified soft tissue disorders: Secondary | ICD-10-CM | POA: Diagnosis not present

## 2019-08-29 DIAGNOSIS — I878 Other specified disorders of veins: Secondary | ICD-10-CM | POA: Diagnosis not present

## 2019-08-29 DIAGNOSIS — E119 Type 2 diabetes mellitus without complications: Secondary | ICD-10-CM | POA: Insufficient documentation

## 2019-08-29 NOTE — Progress Notes (Signed)
  Patient ID: Rebekah Lowery, female   DOB: 11/29/1933, 86 y.o.   MRN: [id number]  Chief Complaint  Patient presents with  . New Patient (Initial Visit)    PVD Consult    HPI Rebekah Lowery is a 86 y.o. female.  I am asked to see the patient by Amanda Ferri, PA-C for evaluation of leg pain, swelling and discoloration.  This has been going on for many years.  She has swelling in the lower leg with chronic stasis changes, significant discoloration, and prominent varicosities.  Her legs swell as the day goes on.  She has been wearing her compression stockings and elevating her legs.  This has improved things somewhat.  She has seen orthopedic service and does have some other causes of pain in her leg, but the prominent varicosities and the swelling prompted evaluation in our office.  No ulceration.  No infection.  No fever or chills.     Past Medical History:  Diagnosis Date  . Anemia   . Cancer (HCC)    left breast  . Change in voice   . Diabetes mellitus   . Family history of lung cancer   . Family history of pancreatic cancer   . Hypertension   . 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  . MVP (mitral valve prolapse)   . PAD (peripheral artery disease) (HCC)   . Stroke (HCC) 04/15/09    Past Surgical History:  Procedure Laterality Date  . ABDOMINAL HYSTERECTOMY    . BREAST SURGERY  01/29/1990   mastectomy of left  . BREAST SURGERY  09/12/2005   partial mastectomy of right  . BREAST SURGERY  2013   Right Mastectomy  . CATARACT EXTRACTION W/PHACO Right 08/03/2014   Procedure: CATARACT EXTRACTION PHACO AND INTRAOCULAR LENS PLACEMENT (IOC);  Surgeon: Steven Dingeldein, MD;  Location: [facility] ORS;  Service: Ophthalmology;  Laterality: Right;  US: 01:17 AP%: 24.0 CDE: 32.46  Fluid Lot# [id number]   . COLONOSCOPY  2010  . EYE SURGERY    . LAPAROSCOPIC HYSTERECTOMY  1982  .  MASTECTOMY     BIL  . TONSILLECTOMY    . WRIST SURGERY       Family History  Problem Relation Age of Onset  . Cancer Sister        breast  . Cancer Maternal Aunt        breast  No bleeding or clotting disorders No aneurysm   Social History   Tobacco Use  . Smoking status: Never Smoker  . Smokeless tobacco: Never Used  Substance Use Topics  . Alcohol use: No    Alcohol/week: 0.0 standard drinks  . Drug use: No     Allergies  Allergen Reactions  . Ibuprofen   . Other   . Aspirin Swelling and Other (See Comments)    Swelling of lips, caused pain also.  . Citric Acid Swelling and Rash    Swelling all over the body.  . Codeine Itching, Swelling and Rash    Swelling was all over the body.  . Lemon Juice Swelling and Rash    Allergic to lemons in general.  . Lemon Oil Rash and Swelling    Allergic to lemons in general.  . Monosodium Glutamate Other (See Comments)    Causes spike in bp, nausea, constipation, gi upset.  . Peanut-Containing Drug Products Nausea Only    Current Outpatient   Medications  Medication Sig Dispense Refill  . Acetaminophen (TYLENOL) 325 MG CAPS Tylenol    . Cholecalciferol (D3-1000) 25 MCG (1000 UT) capsule Take 1,000 Units by mouth daily.    . glipiZIDE (GLUCOTROL XL) 5 MG 24 hr tablet Take 5 mg by mouth daily with breakfast.    . Iron-Vitamins (GERITOL COMPLETE PO) Geritol Complete 16 mg iron-0.38 mg tablet    . lisinopril (PRINIVIL,ZESTRIL) 5 MG tablet Take 5 mg by mouth daily.  1  . Omega-3 Fatty Acids (FISH OIL) 1000 MG CAPS Take by mouth.    . vitamin B-12 (CYANOCOBALAMIN) 500 MCG tablet Take 500 mcg by mouth daily.     No current facility-administered medications for this visit.      REVIEW OF SYSTEMS (Negative unless checked)  Constitutional: []Weight loss  []Fever  []Chills Cardiac: []Chest pain   []Chest pressure   []Palpitations   []Shortness of breath when laying flat   []Shortness of breath at rest   []Shortness of breath  with exertion. Vascular:  []Pain in legs with walking   [x]Pain in legs at rest   []Pain in legs when laying flat   []Claudication   []Pain in feet when walking  []Pain in feet at rest  []Pain in feet when laying flat   []History of DVT   []Phlebitis   [x]Swelling in legs   [x]Varicose veins   []Non-healing ulcers Pulmonary:   []Uses home oxygen   []Productive cough   []Hemoptysis   []Wheeze  []COPD   []Asthma Neurologic:  []Dizziness  []Blackouts   []Seizures   [x]History of stroke   []History of TIA  []Aphasia   []Temporary blindness   []Dysphagia   []Weakness or numbness in arms   []Weakness or numbness in legs Musculoskeletal:  [x]Arthritis   []Joint swelling   [x]Joint pain   []Low back pain Hematologic:  []Easy bruising  []Easy bleeding   []Hypercoagulable state   [x]Anemic  []Hepatitis Gastrointestinal:  []Blood in stool   []Vomiting blood  []Gastroesophageal reflux/heartburn   []Abdominal pain Genitourinary:  []Chronic kidney disease   []Difficult urination  []Frequent urination  []Burning with urination   []Hematuria Skin:  []Rashes   []Ulcers   []Wounds Psychological:  []History of anxiety   [] History of major depression.    Physical Exam BP (!) 190/80   Pulse 70   Ht 5' 8" (1.727 m)   Wt 173 lb (78.5 kg)   BMI 26.30 kg/m  Gen:  WD/WN, NAD.  Appears younger than stated age Head: [place]/AT, No temporalis wasting.  Ear/Nose/Throat: Hearing grossly intact, nares w/o erythema or drainage, oropharynx w/o Erythema/Exudate Eyes: Conjunctiva clear, sclera non-icteric  Neck: trachea midline.  No JVD.  Pulmonary:  Good air movement, respirations not labored, no use of accessory muscles  Cardiac: RRR, no JVD Vascular:  Vessel Right Left  Radial Palpable Palpable                          DP  2+  2+  PT  1+  1+   Gastrointestinal:. No masses, surgical incisions, or scars. Musculoskeletal: M/S 5/5 throughout.  Extremities without ischemic changes.  No deformity or atrophy.  Diffuse  varicosities are present bilaterally measuring 2 to 3 mm in diameter.  Moderate stasis dermatitis changes are present bilaterally particularly in the lower leg.  1+ bilateral lower extremity edema. Neurologic: Sensation grossly intact in extremities.  Symmetrical.  Speech is fluent. Motor exam as listed above.   Psychiatric: Judgment intact, Mood & affect appropriate for pt's clinical situation. Dermatologic: No rashes or ulcers noted.  No cellulitis or open wounds.    Radiology No results found.  Labs No results found for this or any previous visit (from the past 2160 hour(s)).  Assessment/Plan:  Diabetes ([place]) blood glucose control important in reducing the progression of atherosclerotic disease. Also, involved in wound healing. On appropriate medications.   Essential hypertension blood pressure control important in reducing the progression of atherosclerotic disease. On appropriate oral medications.   Venous stasis The patient has clinical signs of significant venous insufficiency with prominent stasis dermatitis, prominent varicosities, and leg swelling.  She has significant pain to go along with the swelling as well.  She has good pedal pulses so I am not concerned about arterial disease.  I have recommend she continue wearing her compression stockings daily.  She should elevate her legs as much as possible and walk and exercise her legs.  I have ordered a venous reflux study to be done in the near future at her convenience.  I discussed the pathophysiology and natural history of venous disease.  I discussed different treatment options.  We will see her back following the noninvasive studies to discuss the results and determine further treatment options.  Swelling of limb I have had a long discussion with the patient regarding swelling and why it  causes symptoms.  Patient will continue wearing graduated compression stockings class 1 (20-30 mmHg) on a daily basis. The patient will   beginning wearing the stockings first thing in the morning and removing them in the evening. The patient is instructed specifically not to sleep in the stockings.   In addition, behavioral modification will be initiated.  This will include frequent elevation, use of over the counter pain medications and exercise such as walking.  I have reviewed systemic causes for chronic edema such as liver, kidney and cardiac etiologies.  The patient denies problems with these organ systems.    Consideration for a lymph pump will also be made based upon the effectiveness of conservative therapy.  This would help to improve the edema control and prevent sequela such as ulcers and infections   Patient should undergo duplex ultrasound of the venous system to ensure that DVT or reflux is not present.  The patient will follow-up with me after the ultrasound.        Leotis Pain 08/29/2019, 3:05 PM   This note was created with Dragon medical transcription system.  Any errors from dictation are unintentional.

## 2019-08-29 NOTE — Patient Instructions (Signed)

## 2019-08-29 NOTE — Assessment & Plan Note (Signed)
I have had a long discussion with the patient regarding swelling and why it  causes symptoms.  Patient will continue wearing graduated compression stockings class 1 (20-30 mmHg) on a daily basis. The patient will  beginning wearing the stockings first thing in the morning and removing them in the evening. The patient is instructed specifically not to sleep in the stockings.   In addition, behavioral modification will be initiated.  This will include frequent elevation, use of over the counter pain medications and exercise such as walking.  I have reviewed systemic causes for chronic edema such as liver, kidney and cardiac etiologies.  The patient denies problems with these organ systems.    Consideration for a lymph pump will also be made based upon the effectiveness of conservative therapy.  This would help to improve the edema control and prevent sequela such as ulcers and infections   Patient should undergo duplex ultrasound of the venous system to ensure that DVT or reflux is not present.  The patient will follow-up with me after the ultrasound.   

## 2019-08-29 NOTE — Assessment & Plan Note (Signed)
blood glucose control important in reducing the progression of atherosclerotic disease. Also, involved in wound healing. On appropriate medications.  

## 2019-08-29 NOTE — Assessment & Plan Note (Signed)
The patient has clinical signs of significant venous insufficiency with prominent stasis dermatitis, prominent varicosities, and leg swelling.  She has significant pain to go along with the swelling as well.  She has good pedal pulses so I am not concerned about arterial disease.  I have recommend she continue wearing her compression stockings daily.  She should elevate her legs as much as possible and walk and exercise her legs.  I have ordered a venous reflux study to be done in the near future at her convenience.  I discussed the pathophysiology and natural history of venous disease.  I discussed different treatment options.  We will see her back following the noninvasive studies to discuss the results and determine further treatment options.

## 2019-08-29 NOTE — Assessment & Plan Note (Signed)
blood pressure control important in reducing the progression of atherosclerotic disease. On appropriate oral medications.  

## 2019-10-03 ENCOUNTER — Other Ambulatory Visit: Payer: Self-pay

## 2019-10-03 ENCOUNTER — Ambulatory Visit (INDEPENDENT_AMBULATORY_CARE_PROVIDER_SITE_OTHER): Payer: Medicare Other

## 2019-10-03 ENCOUNTER — Ambulatory Visit (INDEPENDENT_AMBULATORY_CARE_PROVIDER_SITE_OTHER): Payer: Medicare Other | Admitting: Vascular Surgery

## 2019-10-03 ENCOUNTER — Encounter (INDEPENDENT_AMBULATORY_CARE_PROVIDER_SITE_OTHER): Payer: Self-pay | Admitting: Vascular Surgery

## 2019-10-03 VITALS — BP 183/77 | HR 70 | Ht 68.0 in | Wt 174.0 lb

## 2019-10-03 DIAGNOSIS — I878 Other specified disorders of veins: Secondary | ICD-10-CM | POA: Diagnosis not present

## 2019-10-03 DIAGNOSIS — E119 Type 2 diabetes mellitus without complications: Secondary | ICD-10-CM

## 2019-10-03 DIAGNOSIS — M7989 Other specified soft tissue disorders: Secondary | ICD-10-CM | POA: Diagnosis not present

## 2019-10-03 DIAGNOSIS — I10 Essential (primary) hypertension: Secondary | ICD-10-CM | POA: Diagnosis not present

## 2019-10-03 DIAGNOSIS — I83813 Varicose veins of bilateral lower extremities with pain: Secondary | ICD-10-CM | POA: Insufficient documentation

## 2019-10-03 NOTE — Assessment & Plan Note (Signed)
Venous reflux study today demonstrates bilateral great saphenous vein reflux.  No DVT or superficial thrombophlebitis is seen.  The patient has not had improvement with compression stockings, elevation, and increasing her exercise level.  At this point, she is interested in proceeding with intervention.  We discussed the risks and benefits of laser ablation of both great saphenous veins in a staged fashion.  She is agreeable and desires to proceed.

## 2019-10-03 NOTE — Progress Notes (Signed)
MRN : [id number]  Rebekah Lowery is a 86 y.o. (December 08, 1933) female who presents with chief complaint of  Chief Complaint  Patient presents with  . Follow-up    U/S follow up  .  History of Present Illness: Patient returns today in follow up of her leg pain and swelling as well as discoloration.  She has not had any significant improvement with the regular use of compression stockings, leg elevation, and increasing her activity from her last visit.  She denies any open wounds or infection.  No fevers or chills. Venous reflux study today demonstrates bilateral great saphenous vein reflux.  No DVT or superficial thrombophlebitis is seen.  Current Outpatient Medications  Medication Sig Dispense Refill  . Acetaminophen (TYLENOL) 325 MG CAPS Tylenol    . Cholecalciferol (D3-1000) 25 MCG (1000 UT) capsule Take 1,000 Units by mouth daily.    Marland Kitchen glipiZIDE (GLUCOTROL XL) 5 MG 24 hr tablet Take 5 mg by mouth daily with breakfast.    . Iron-Vitamins (GERITOL COMPLETE PO) Geritol Complete 16 mg iron-0.38 mg tablet    . lisinopril (PRINIVIL,ZESTRIL) 5 MG tablet Take 5 mg by mouth daily.  1  . Omega-3 Fatty Acids (FISH OIL) 1000 MG CAPS Take by mouth.    . vitamin B-12 (CYANOCOBALAMIN) 500 MCG tablet Take 500 mcg by mouth daily.     No current facility-administered medications for this visit.    Past Medical History:  Diagnosis Date  . Anemia   . Cancer ([place])    left breast  . Change in voice   . Diabetes mellitus   . Family history of lung cancer   . Family history of pancreatic cancer   . Hypertension   . 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  . MVP (mitral valve prolapse)   . PAD (peripheral artery disease) ([employer])   . Stroke [facility]) 04/15/09    Past Surgical History:  Procedure Laterality Date  . ABDOMINAL HYSTERECTOMY    . BREAST SURGERY  01/29/1990   mastectomy of left  .  BREAST SURGERY  09/12/2005   partial mastectomy of right  . BREAST SURGERY  2013   Right Mastectomy  . CATARACT EXTRACTION W/PHACO Right 08/03/2014   Procedure: CATARACT EXTRACTION PHACO AND INTRAOCULAR LENS PLACEMENT (IOC);  Surgeon: Estill Cotta, MD;  Location: [facility] ORS;  Service: Ophthalmology;  Laterality: Right;  [place]: 01:17 AP%: 24.0 CDE: 32.46  Fluid Lot# [id number]   . COLONOSCOPY  2010  . EYE SURGERY    . LAPAROSCOPIC HYSTERECTOMY  1982  . MASTECTOMY     BIL  . TONSILLECTOMY    . WRIST SURGERY       Social History   Tobacco Use  . Smoking status: Never Smoker  . Smokeless tobacco: Never Used  Substance Use Topics  . Alcohol use: No    Alcohol/week: 0.0 standard drinks  . Drug use: No      Family History  Problem Relation Age of Onset  . Cancer Sister        breast  . Cancer Maternal Aunt        breast     Allergies  Allergen Reactions  . Ibuprofen   . Other   . Aspirin Swelling and Other (See Comments)    Swelling of lips, caused pain also.  . Citric Acid Swelling and Rash    Swelling all over the body.  Marland Kitchen  Codeine Itching, Swelling and Rash    Swelling was all over the body.  [place] Juice Swelling and Rash    Allergic to lemons in general.  . Lemon Oil Rash and Swelling    Allergic to lemons in general.  . Monosodium Glutamate Other (See Comments)    Causes spike in bp, nausea, constipation, gi upset.  . Peanut-Containing Drug Products Nausea Only     REVIEW OF SYSTEMS (Negative unless checked)  Constitutional: _0 ?Weight loss  _1 ?Fever  _2 ?Chills Cardiac: _3 ?Chest pain   _4 ?Chest pressure   _5 ?Palpitations   _6 ?Shortness of breath when laying flat   _7 ?Shortness of breath at rest   _8 ?Shortness of breath with exertion. Vascular:  _9 ?Pain in legs with walking   _10 ?Pain in legs at rest   _11 ?Pain in legs when laying flat   _12 ?Claudication   _13 ?Pain in feet when walking  _14 ?Pain in feet at rest  _15 ?Pain in feet when laying flat   _16 ?History of  DVT   _17 ?Phlebitis   _18 ?Swelling in legs   _19 ?Varicose veins   _20 ?Non-healing ulcers Pulmonary:   _21 ?Uses home oxygen   _22 ?Productive cough   _23 ?Hemoptysis   _24 ?Wheeze  _25 ?COPD   _26 ?Asthma Neurologic:  _27 ?Dizziness  _28 ?Blackouts   _29 ?Seizures   _30 ?History of stroke   _31 ?History of TIA  _32 ?Aphasia   _33 ?Temporary blindness   _34 ?Dysphagia   _35 ?Weakness or numbness in arms   _36 ?Weakness or numbness in legs Musculoskeletal:  _37 ?Arthritis   _38 ?Joint swelling   _39 ?Joint pain   _40 ?Low back pain Hematologic:  _41 ?Easy bruising  _42 ?Easy bleeding   _43 ?Hypercoagulable state   _44 ?Anemic  _45 ?Hepatitis Gastrointestinal:  _46 ?Blood in stool   _47 ?Vomiting blood  _48 ?Gastroesophageal reflux/heartburn   _49 ?Abdominal pain Genitourinary:  _50 ?Chronic kidney disease   _51 ?Difficult urination  _52 ?Frequent urination  _53 ?Burning with urination   _54 ?Hematuria Skin:  _55 ?Rashes   _56 ?Ulcers   _57 ?Wounds Psychological:  _58 ?History of anxiety   _59 ? History of major depression.  Physical Examination  BP (!) 183/77   Pulse 70   Ht _60  (1.727 m)   Wt 174 lb (78.9 kg)   BMI 26.46 kg/m  Gen:  WD/WN, NAD.  Appears younger than stated age Head: [place]/AT, No temporalis wasting. Ear/Nose/Throat: Hearing grossly intact, nares w/o erythema or drainage Eyes: Conjunctiva clear. Sclera non-icteric Neck: Supple.  Trachea midline Pulmonary:  Good air movement, no use of accessory muscles.  Cardiac: RRR, no JVD Vascular:  Vessel Right Left  Radial Palpable Palpable                          PT Palpable Palpable  DP Palpable Palpable    Musculoskeletal: M/S 5/5 throughout.  No deformity or atrophy.  Significant stasis dermatitis changes present in both legs.  1+ bilateral lower extremity edema. Neurologic: Sensation grossly intact in extremities.  Symmetrical.  Speech is fluent.  Psychiatric: Judgment intact, Mood & affect appropriate for pt's clinical situation. Dermatologic: No rashes or ulcers noted.  No cellulitis or open  wounds.       Labs No results found for this or any previous visit (from the past 2160 hour(s)).  Radiology No results found.  Assessment/Plan Diabetes (HCC) blood glucose control important in reducing the progression of atherosclerotic disease. Also, involved in wound healing. On appropriate medications.   Essential hypertension blood pressure control important in reducing the progression of atherosclerotic disease. On appropriate oral medications.  Swelling of limb Not much improvement with compression stockings and  elevation.  Varicose veins of leg with pain, bilateral Venous reflux study today demonstrates bilateral great saphenous vein reflux.  No DVT or superficial thrombophlebitis is seen.  The patient has not had improvement with compression stockings, elevation, and increasing her exercise level.  At this point, she is interested in proceeding with intervention.  We discussed the risks and benefits of laser ablation of both great saphenous veins in a staged fashion.  She is agreeable and desires to proceed.    Leotis Pain, MD  10/03/2019 11:04 AM    This note was created with Dragon medical transcription system.  Any errors from dictation are purely unintentional

## 2019-10-03 NOTE — Patient Instructions (Signed)
Nonsurgical Procedures for Varicose Veins Various nonsurgical procedures can be used to treat varicose veins. Varicose veins are swollen, twisted veins that are visible under the skin. They occur most often in the legs. These veins may appear blue and bulging. Varicose veins are caused by damage to the valves in veins. All veins have a valve that makes blood flow in only one direction. If a valve gets weak or damaged, blood can pool and cause varicose veins. You may need a procedure to treat your varicose veins if they are causing symptoms or complications, or if lifestyle changes have not helped. These procedures can reduce pain, aching, and the risk of bleeding and blood clots. They can also improve the way the affected area looks (cosmetic appearance). The three common nonsurgical procedures are:  Sclerotherapy. A chemical is injected to close off a vein.  Laser treatment. Light energy is applied to close off the vein.  Radiofrequency vein ablation. Electrical energy is used to produce heat that closes off the vein. Your health care provider will discuss the method that is best for you based on your condition. Tell a health care provider about:  Any allergies you have.  All medicines you are taking, including vitamins, herbs, eye drops, creams, and over-the-counter medicines.  Any problems you or family members have had with anesthetic medicines.  Any blood disorders you have.  Any surgeries you have had.  Any medical conditions you have.  Whether you are pregnant or may be pregnant. What are the risks? Generally, this is a safe procedure. However, problems may occur, including:  Damage to nearby nerves, tissues, or veins.  Skin irritation, sores, or dark spots.  Numbness.  Clotting.  Infection.  Allergic reactions to medicines.  Scarring.  Leg swelling.  Need for additional treatments.  Bruising. What happens before the procedure?  Ask your health care provider  about: ? Changing or stopping your regular medicines. This is especially important if you are taking diabetes medicines or blood thinners. ? Taking over-the-counter medicines, vitamins, herbs, and supplements. ? Taking medicines such as aspirin and ibuprofen. These medicines can thin your blood. Do not take these medicines unless your health care provider tells you to take them.  You may have an exam or testing. This can include a tests to: ? Check for clots and check blood flow using sound waves (Doppler ultrasound). ? Observe how blood flows through your veins by injecting a dye that outlines your veins on X-rays (angiogram). This test is used in rare cases. What happens during the procedure? One of the following procedures will be performed: Sclerotherapy This procedure is often used for small to medium veins.  A chemical (sclerosant) that irritates the lining of the vein will be injected into the vein. This will cause the varicose vein to be closed off. Sclerosants in different amounts and strengths can be used, depending on the size and location of the vein.  All of the varicose vein sites will be injected. You may need more than one treatment because new varicose veins may develop, or more than one injection may be needed for each varicose vein.  Laser treatment There are two ways that lasers are used to treat varicose veins:  Light energy from a laser may be directed onto the vein through the skin.  A needle may be used to pass a thin laser catheter into the vein to cause it to close. You may need more than one treatment if the vein re-opens. In some cases,   laser treatment may be combined with sclerotherapy. Radiofrequency vein ablation   You will be given a medicine that numbs the area (local anesthetic).  A small incision will be made near the varicose vein.  A thin tube (catheter) will be threaded into your vein.  The tip of the catheter will deploy electrodes.  The  electrodes will deliver electrical energy to produce heat that closes off the vein. What happens after the procedure?  A bandage (dressing) may be used to cover the injection site or incisions.  You may have to wear compression stockings. These stockings help to prevent blood clots and reduce swelling in your legs.  Return to your normal activities as told by your health care provider. Summary  Varicose veins are swollen, twisted veins that are visible under the skin. They occur most often in the legs.  Various procedures can be used to treat varicose veins. You may need a procedure to treat your varicose veins if they are causing symptoms or complications, or if lifestyle changes have not helped.  Your health care provider will discuss the method that is best for you based on your condition. This information is not intended to replace advice given to you by your health care provider. Make sure you discuss any questions you have with your health care provider. Document Revised: 04/12/2018 Document Reviewed: 03/31/2016 Elsevier Patient Education  2020 [employer]  

## 2019-10-03 NOTE — Assessment & Plan Note (Signed)
Not much improvement with compression stockings and elevation.

## 2020-02-20 ENCOUNTER — Other Ambulatory Visit: Payer: Self-pay

## 2020-02-20 ENCOUNTER — Ambulatory Visit (INDEPENDENT_AMBULATORY_CARE_PROVIDER_SITE_OTHER): Payer: Medicare Other | Admitting: Vascular Surgery

## 2020-02-20 VITALS — BP 158/80 | HR 67 | Ht 68.0 in | Wt 170.0 lb

## 2020-02-20 DIAGNOSIS — I83813 Varicose veins of bilateral lower extremities with pain: Secondary | ICD-10-CM

## 2020-02-20 NOTE — Progress Notes (Signed)
Rebekah Lowery is a 87 y.o. female who presents with symptomatic venous reflux  Past Medical History:  Diagnosis Date  . Anemia   . Cancer ([place])    left breast  . Change in voice   . Diabetes mellitus   . Family history of lung cancer   . Family history of pancreatic cancer   . Hypertension   . 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  . MVP (mitral valve prolapse)   . PAD (peripheral artery disease) ([place])   . Stroke [facility]) 04/15/09    Past Surgical History:  Procedure Laterality Date  . ABDOMINAL HYSTERECTOMY    . BREAST SURGERY  01/29/1990   mastectomy of left  . BREAST SURGERY  09/12/2005   partial mastectomy of right  . BREAST SURGERY  2013   Right Mastectomy  . CATARACT EXTRACTION W/PHACO Right 08/03/2014   Procedure: CATARACT EXTRACTION PHACO AND INTRAOCULAR LENS PLACEMENT (IOC);  Surgeon: Estill Cotta, MD;  Location: [facility] ORS;  Service: Ophthalmology;  Laterality: Right;  [place]: 01:17 AP%: 24.0 CDE: 32.46  Fluid Lot# [id number]   . COLONOSCOPY  2010  . EYE SURGERY    . LAPAROSCOPIC HYSTERECTOMY  1982  . MASTECTOMY     BIL  . TONSILLECTOMY    . WRIST SURGERY       Current Outpatient Medications:  .  Acetaminophen (TYLENOL) 325 MG CAPS, Tylenol, Disp: , Rfl:  .  Cholecalciferol (D3-1000) 25 MCG (1000 UT) capsule, Take 1,000 Units by mouth daily., Disp: , Rfl:  .  glipiZIDE (GLUCOTROL XL) 5 MG 24 hr tablet, Take 5 mg by mouth daily with breakfast., Disp: , Rfl:  .  Iron-Vitamins (GERITOL COMPLETE PO), Geritol Complete 16 mg iron-0.38 mg tablet, Disp: , Rfl:  .  lisinopril (PRINIVIL,ZESTRIL) 5 MG tablet, Take 5 mg by mouth daily., Disp: , Rfl: 1 .  Omega-3 Fatty Acids (FISH OIL) 1000 MG CAPS, Take by mouth., Disp: , Rfl:  .  vitamin B-12 (CYANOCOBALAMIN) 500 MCG tablet, Take 500 mcg by mouth daily., Disp: , Rfl:   Allergies  Allergen Reactions  .  Ibuprofen   . Other   . Aspirin Swelling and Other (See Comments)    Swelling of lips, caused pain also.  . Citric Acid Swelling and Rash    Swelling all over the body.  . Codeine Itching, Swelling and Rash    Swelling was all over the body.  Koren Bound Juice Swelling and Rash    Allergic to lemons in general.  . Lemon Oil Rash and Swelling    Allergic to lemons in general.  . Monosodium Glutamate Other (See Comments)    Causes spike in bp, nausea, constipation, gi upset.  . Peanut-Containing Drug Products Nausea Only     Varicose veins of leg with pain, bilateral     PLAN: The patient's left lower extremity was sterilely prepped and draped. The ultrasound machine was used to visualize the saphenous vein throughout its course. A segment in the upper calf was selected for access. The saphenous vein was accessed without difficulty using ultrasound guidance with a micropuncture needle. A 0.018 wire was then placed beyond the saphenofemoral junction and the needle was removed. The 65 cm sheath was then placed over the wire and the wire and dilator were removed. The laser fiber was then placed through the sheath and its tip was placed approximately 4-5 centimeters below the  saphenofemoral junction. Tumescent anesthesia was then created with a dilute lidocaine solution. Laser energy was then delivered with constant withdrawal of the sheath and laser fiber. Approximately 1319 joules of energy were delivered over a length of 37 centimeters using a 1470 Hz VenaCure machine at 7 W. Sterile dressings were placed. The patient tolerated the procedure well without obvious complications.   Follow-up in 1 week with post-laser duplex.

## 2020-02-24 ENCOUNTER — Other Ambulatory Visit (INDEPENDENT_AMBULATORY_CARE_PROVIDER_SITE_OTHER): Payer: Self-pay | Admitting: Vascular Surgery

## 2020-02-24 DIAGNOSIS — Z9889 Other specified postprocedural states: Secondary | ICD-10-CM

## 2020-02-25 ENCOUNTER — Ambulatory Visit (INDEPENDENT_AMBULATORY_CARE_PROVIDER_SITE_OTHER): Payer: Medicare Other

## 2020-02-25 ENCOUNTER — Other Ambulatory Visit: Payer: Self-pay

## 2020-02-25 DIAGNOSIS — Z9889 Other specified postprocedural states: Secondary | ICD-10-CM

## 2020-03-02 HISTORY — PX: OTHER SURGICAL HISTORY: SHX169

## 2020-03-03 ENCOUNTER — Telehealth (INDEPENDENT_AMBULATORY_CARE_PROVIDER_SITE_OTHER): Payer: Self-pay

## 2020-03-03 NOTE — Telephone Encounter (Addendum)
Patient left a message regarding needing xanax for her laser procedure on 03/05/20 with Dr. Lucky Cowboy. A message was left for a return call. A prescription has been called into the patient's pharmacy at [facility] For Xanax 0.5 mg #2 with no refills, take one and hour before procedure and one at the office. Patient was notified.

## 2020-03-04 ENCOUNTER — Other Ambulatory Visit (INDEPENDENT_AMBULATORY_CARE_PROVIDER_SITE_OTHER): Payer: Self-pay | Admitting: Vascular Surgery

## 2020-03-04 DIAGNOSIS — I83813 Varicose veins of bilateral lower extremities with pain: Secondary | ICD-10-CM

## 2020-03-05 ENCOUNTER — Encounter (INDEPENDENT_AMBULATORY_CARE_PROVIDER_SITE_OTHER): Payer: Self-pay | Admitting: Vascular Surgery

## 2020-03-05 ENCOUNTER — Other Ambulatory Visit: Payer: Self-pay

## 2020-03-05 ENCOUNTER — Ambulatory Visit (INDEPENDENT_AMBULATORY_CARE_PROVIDER_SITE_OTHER): Payer: Medicare Other | Admitting: Vascular Surgery

## 2020-03-05 VITALS — BP 138/74 | HR 63 | Ht 68.0 in | Wt 172.0 lb

## 2020-03-05 DIAGNOSIS — I83813 Varicose veins of bilateral lower extremities with pain: Secondary | ICD-10-CM | POA: Diagnosis not present

## 2020-03-05 NOTE — Progress Notes (Signed)
Rebekah Lowery is a 87 y.o. female who presents with symptomatic venous reflux  Past Medical History:  Diagnosis Date  . Anemia   . Cancer ([place])    left breast  . Change in voice   . Diabetes mellitus   . Family history of lung cancer   . Family history of pancreatic cancer   . Hypertension   . 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  . MVP (mitral valve prolapse)   . PAD (peripheral artery disease) ([place])   . Stroke [facility]) 04/15/09    Past Surgical History:  Procedure Laterality Date  . ABDOMINAL HYSTERECTOMY    . BREAST SURGERY  01/29/1990   mastectomy of left  . BREAST SURGERY  09/12/2005   partial mastectomy of right  . BREAST SURGERY  2013   Right Mastectomy  . CATARACT EXTRACTION W/PHACO Right 08/03/2014   Procedure: CATARACT EXTRACTION PHACO AND INTRAOCULAR LENS PLACEMENT (IOC);  Surgeon: Estill Cotta, MD;  Location: [facility] ORS;  Service: Ophthalmology;  Laterality: Right;  [place]: 01:17 AP%: 24.0 CDE: 32.46  Fluid Lot# [id number] H   . COLONOSCOPY  2010  . EYE SURGERY    . LAPAROSCOPIC HYSTERECTOMY  1982  . MASTECTOMY     BIL  . TONSILLECTOMY    . WRIST SURGERY       Current Outpatient Medications:  .  Acetaminophen (TYLENOL) 325 MG CAPS, Tylenol, Disp: , Rfl:  .  Cholecalciferol (D3-1000) 25 MCG (1000 UT) capsule, Take 1,000 Units by mouth daily., Disp: , Rfl:  .  glipiZIDE (GLUCOTROL XL) 5 MG 24 hr tablet, Take 5 mg by mouth daily with breakfast., Disp: , Rfl:  .  Iron-Vitamins (GERITOL COMPLETE PO), Geritol Complete 16 mg iron-0.38 mg tablet, Disp: , Rfl:  .  lisinopril (PRINIVIL,ZESTRIL) 5 MG tablet, Take 5 mg by mouth daily., Disp: , Rfl: 1 .  Omega-3 Fatty Acids (FISH OIL) 1000 MG CAPS, Take by mouth., Disp: , Rfl:  .  vitamin B-12 (CYANOCOBALAMIN) 500 MCG tablet, Take 500 mcg by mouth daily., Disp: , Rfl:   Allergies  Allergen Reactions  .  Ibuprofen   . Other   . Aspirin Swelling and Other (See Comments)    Swelling of lips, caused pain also.  . Citric Acid Swelling and Rash    Swelling all over the body.  . Codeine Itching, Swelling and Rash    Swelling was all over the body.  Koren Bound Juice Swelling and Rash    Allergic to lemons in general.  . Lemon Oil Rash and Swelling    Allergic to lemons in general.  . Monosodium Glutamate Other (See Comments)    Causes spike in bp, nausea, constipation, gi upset.  . Peanut-Containing Drug Products Nausea Only     Varicose veins of leg with pain, bilateral     PLAN: The patient's right lower extremity was sterilely prepped and draped. The ultrasound machine was used to visualize the saphenous vein throughout its course.  There appeared to be a duplicate saphenous vein system, and I selected the larger and less superficial of the 2 veins.  A segment in the saphenous vein just above the knee was selected for access. The saphenous vein was accessed without difficulty using ultrasound guidance with a micropuncture needle. A 0.018 wire was then placed beyond the saphenofemoral junction and the needle was removed. The 65 cm sheath was then placed over the  wire and the wire and dilator were removed. The laser fiber was then placed through the sheath and its tip was placed around the junction of the 2 saphenous vein systems in the upper thigh well below the saphenofemoral junction. Tumescent anesthesia was then created with a dilute lidocaine solution. Laser energy was then delivered with constant withdrawal of the sheath and laser fiber. Approximately 711 joules of energy were delivered over a length of 18 centimeters using a 1470 Hz VenaCure machine at 7 W. Sterile dressings were placed. The patient tolerated the procedure well without obvious complications.   Follow-up in 1 week with post-laser duplex.

## 2020-03-10 ENCOUNTER — Ambulatory Visit (INDEPENDENT_AMBULATORY_CARE_PROVIDER_SITE_OTHER): Payer: Medicare Other

## 2020-03-10 ENCOUNTER — Other Ambulatory Visit: Payer: Self-pay

## 2020-03-10 DIAGNOSIS — I83813 Varicose veins of bilateral lower extremities with pain: Secondary | ICD-10-CM

## 2020-04-09 ENCOUNTER — Ambulatory Visit (INDEPENDENT_AMBULATORY_CARE_PROVIDER_SITE_OTHER): Payer: Medicare Other | Admitting: Vascular Surgery

## 2020-04-09 ENCOUNTER — Other Ambulatory Visit: Payer: Self-pay

## 2020-04-09 ENCOUNTER — Encounter (INDEPENDENT_AMBULATORY_CARE_PROVIDER_SITE_OTHER): Payer: Self-pay | Admitting: Vascular Surgery

## 2020-04-09 VITALS — BP 167/82 | HR 69 | Ht 68.0 in | Wt 176.0 lb

## 2020-04-09 DIAGNOSIS — I10 Essential (primary) hypertension: Secondary | ICD-10-CM | POA: Diagnosis not present

## 2020-04-09 DIAGNOSIS — I83813 Varicose veins of bilateral lower extremities with pain: Secondary | ICD-10-CM

## 2020-04-09 DIAGNOSIS — M7989 Other specified soft tissue disorders: Secondary | ICD-10-CM

## 2020-04-09 DIAGNOSIS — E119 Type 2 diabetes mellitus without complications: Secondary | ICD-10-CM

## 2020-04-09 NOTE — Assessment & Plan Note (Signed)
blood glucose control important in reducing the progression of atherosclerotic disease. Also, involved in wound healing. On appropriate medications.  

## 2020-04-09 NOTE — Assessment & Plan Note (Signed)
Symptoms improved after laser ablation of both great saphenous veins in a staged fashion.  Still with some prominent varicosities, but she is having less pain and swelling.  At this point, she is not interested in having any sclerotherapy which I think is reasonable because her symptoms are very mild.  We will plan to see her back in 3 to 4 months to ensure that she is still doing well symptomatically.

## 2020-04-09 NOTE — Assessment & Plan Note (Signed)
Improved but not resolved after laser ablation of the great saphenous vein bilaterally.

## 2020-04-09 NOTE — Assessment & Plan Note (Signed)
blood pressure control important in reducing the progression of atherosclerotic disease. On appropriate oral medications.  

## 2020-04-09 NOTE — Progress Notes (Signed)
MRN : [id number]  Rebekah Lowery is a 87 y.o. ([birth year]) female who presents with chief complaint of  Chief Complaint  Patient presents with  . Follow-up    4 wk Post laser   .  History of Present Illness: Patient returns today in follow up of her venous insufficiency.  Over the past couple of months, she has undergone bilateral great saphenous vein laser ablation in a staged fashion.  She has done well from this.  She denied any periprocedural complications.  Both duplex that showed successful ablation without DVT.  She still has some swelling particularly in the left leg but this is less frequent and less severe.  Current Outpatient Medications  Medication Sig Dispense Refill  . Acetaminophen (TYLENOL) 325 MG CAPS Tylenol    . Cholecalciferol (D3-1000) 25 MCG (1000 UT) capsule Take 1,000 Units by mouth daily.    Marland Kitchen glipiZIDE (GLUCOTROL XL) 5 MG 24 hr tablet Take 5 mg by mouth daily with breakfast.    . Iron-Vitamins (GERITOL COMPLETE PO) Geritol Complete 16 mg iron-0.38 mg tablet    . lisinopril (PRINIVIL,ZESTRIL) 5 MG tablet Take 5 mg by mouth daily.  1  . Omega-3 Fatty Acids (FISH OIL) 1000 MG CAPS Take by mouth.    . vitamin B-12 (CYANOCOBALAMIN) 500 MCG tablet Take 500 mcg by mouth daily.     No current facility-administered medications for this visit.    Past Medical History:  Diagnosis Date  . Anemia   . Cancer ([place])    left breast  . Change in voice   . Diabetes mellitus   . Family history of lung cancer   . Family history of pancreatic cancer   . Hypertension   . 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  . MVP (mitral valve prolapse)   . PAD (peripheral artery disease) ([place])   . Stroke [facility]) 04/15/09    Past Surgical History:  Procedure Laterality Date  . ABDOMINAL HYSTERECTOMY    . BREAST SURGERY  01/29/1990   mastectomy of left  . BREAST SURGERY   09/12/2005   partial mastectomy of right  . BREAST SURGERY  2013   Right Mastectomy  . CATARACT EXTRACTION W/PHACO Right 08/03/2014   Procedure: CATARACT EXTRACTION PHACO AND INTRAOCULAR LENS PLACEMENT (IOC);  Surgeon: Estill Cotta, MD;  Location: [facility] ORS;  Service: Ophthalmology;  Laterality: Right;  [place]: 01:17 AP%: 24.0 CDE: 32.46  Fluid Lot# [id number]   . COLONOSCOPY  2010  . EYE SURGERY    . LAPAROSCOPIC HYSTERECTOMY  1982  . MASTECTOMY     BIL  . TONSILLECTOMY    . WRIST SURGERY       Social History   Tobacco Use  . Smoking status: Never Smoker  . Smokeless tobacco: Never Used  Substance Use Topics  . Alcohol use: No    Alcohol/week: 0.0 standard drinks  . Drug use: No       Family History  Problem Relation Age of Onset  . Cancer Sister        breast  . Cancer Maternal Aunt        breast     Allergies  Allergen Reactions  . Ibuprofen   . Other   . Aspirin Swelling and Other (See Comments)    Swelling of lips, caused pain also.  . Citric Acid Swelling and Rash    Swelling all over the  body.  . Codeine Itching, Swelling and Rash    Swelling was all over the body.  Koren Bound Juice Swelling and Rash    Allergic to lemons in general.  . Lemon Oil Rash and Swelling    Allergic to lemons in general.  . Monosodium Glutamate Other (See Comments)    Causes spike in bp, nausea, constipation, gi upset.  . Peanut-Containing Drug Products Nausea Only     REVIEW OF SYSTEMS (Negative unless checked)  Constitutional: []Weight loss  []Fever  []Chills Cardiac: []Chest pain   []Chest pressure   []Palpitations   []Shortness of breath when laying flat   []Shortness of breath at rest   []Shortness of breath with exertion. Vascular:  []Pain in legs with walking   []Pain in legs at rest   []Pain in legs when laying flat   []Claudication   []Pain in feet when walking  []Pain in feet at rest  []Pain in feet when laying flat   []History of DVT   []Phlebitis   [x]Swelling in  legs   [x]Varicose veins   []Non-healing ulcers Pulmonary:   []Uses home oxygen   []Productive cough   []Hemoptysis   []Wheeze  []COPD   []Asthma Neurologic:  []Dizziness  []Blackouts   []Seizures   []History of stroke   []History of TIA  []Aphasia   []Temporary blindness   []Dysphagia   []Weakness or numbness in arms   []Weakness or numbness in legs Musculoskeletal:  [x]Arthritis   []Joint swelling   [x]Joint pain   []Low back pain Hematologic:  []Easy bruising  []Easy bleeding   []Hypercoagulable state   []Anemic   Gastrointestinal:  []Blood in stool   []Vomiting blood  []Gastroesophageal reflux/heartburn   []Abdominal pain Genitourinary:  []Chronic kidney disease   []Difficult urination  []Frequent urination  []Burning with urination   []Hematuria Skin:  []Rashes   []Ulcers   []Wounds Psychological:  []History of anxiety   [] History of major depression.  Physical Examination  BP (!) 167/82   Pulse 69   Ht 5' 8" (1.727 m)   Wt 176 lb (79.8 kg)   BMI 26.76 kg/m  Gen:  WD/WN, NAD.  Appears younger than stated age Head: [place]/AT, No temporalis wasting. Ear/Nose/Throat: Hearing grossly intact, nares w/o erythema or drainage Eyes: Conjunctiva clear. Sclera non-icteric Neck: Supple.  Trachea midline Pulmonary:  Good air movement, no use of accessory muscles.  Cardiac: RRR, no JVD Vascular: Diffuse varicosities present bilaterally Vessel Right Left  Radial Palpable Palpable           Musculoskeletal: M/S 5/5 throughout.  No deformity or atrophy.  No significant right lower extremity edema, trace left lower extremity edema. Neurologic: Sensation grossly intact in extremities.  Symmetrical.  Speech is fluent.  Psychiatric: Judgment intact, Mood & affect appropriate for pt's clinical situation. Dermatologic: No rashes or ulcers noted.  No cellulitis or open wounds.       Labs No results found for this or any previous visit (from the past 2160 hour(s)).  Radiology VAS [place] LOWER  EXTREMITY VENOUS POST ABLATION  Result Date: 03/12/2020  Lower Venous Reflux Study Indications: Post Rt GSV Ablation.  Performing Technologist: Almira Coaster RVS  Examination Guidelines: A complete evaluation includes B-mode imaging, spectral Doppler, color Doppler, and power Doppler as needed of all accessible portions of each vessel. Bilateral testing is considered an integral part of a complete examination. Limited examinations for reoccurring indications may be performed as noted. The reflux portion of the  exam is performed with the patient in reverse Trendelenburg. Significant venous reflux is defined as >500 ms in the superficial venous system, and >1 second in the deep venous system.  Venous Reflux Times +--------------+--------+------+----------+------------+-----------------------+ RIGHT         Reflux  Reflux  Reflux  Diameter cmsComments                              No       Yes     Time                                       +--------------+--------+------+----------+------------+-----------------------+ CFV           no                                                          +--------------+--------+------+----------+------------+-----------------------+ FV prox       no                                                          +--------------+--------+------+----------+------------+-----------------------+ FV mid        no                                                          +--------------+--------+------+----------+------------+-----------------------+ FV dist       no                                                          +--------------+--------+------+----------+------------+-----------------------+ Popliteal     no                                                          +--------------+--------+------+----------+------------+-----------------------+ GSV at SFJ    no                                                           +--------------+--------+------+----------+------------+-----------------------+ GSV prox thighno                                                          +--------------+--------+------+----------+------------+-----------------------+ GSV mid thigh no  prior                                                                     ablation/stripping      +--------------+--------+------+----------+------------+-----------------------+ GSV dist thighno                                  prior                                                                     ablation/stripping      +--------------+--------+------+----------+------------+-----------------------+ GSV at knee   no                                  prior                                                                     ablation/stripping      +--------------+--------+------+----------+------------+-----------------------+ GSV prox calf no                                                          +--------------+--------+------+----------+------------+-----------------------+ SSV Pop Fossa          yes   2831 ms                                      +--------------+--------+------+----------+------------+-----------------------+   Summary: Right: - No evidence of deep vein thrombosis seen in the right lower extremity, from the common femoral through the popliteal veins.  - No evidence of superficial venous thrombosis in the right lower extremity.  - There is no evidence of venous reflux seen in the right lower extremity.  - No evidence of superficial venous reflux seen in the right greater saphenous vein.  - No flow seen in the Rght GSV from the Knee level to Proximal Thigh level via Ablation.  *See table(s) above for measurements and observations. Electronically signed by Leotis Pain MD on 03/12/2020 at 9:43:06 AM.    Final     Assessment/Plan  Varicose veins of leg  with pain, bilateral Symptoms improved after laser ablation of both great saphenous veins in a staged fashion.  Still with some prominent varicosities, but she is having less pain and swelling.  At this point, she is not interested in having any sclerotherapy which I think is reasonable because her symptoms are very mild.  We will plan to  see her back in 3 to 4 months to ensure that she is still doing well symptomatically.  Essential hypertension blood pressure control important in reducing the progression of atherosclerotic disease. On appropriate oral medications.   Swelling of limb Improved but not resolved after laser ablation of the great saphenous vein bilaterally.  Diabetes ([place]) blood glucose control important in reducing the progression of atherosclerotic disease. Also, involved in wound healing. On appropriate medications.     Leotis Pain, MD  04/09/2020 10:40 AM    This note was created with Dragon medical transcription system.  Any errors from dictation are purely unintentional

## 2020-07-09 ENCOUNTER — Other Ambulatory Visit: Payer: Self-pay

## 2020-07-09 ENCOUNTER — Encounter (INDEPENDENT_AMBULATORY_CARE_PROVIDER_SITE_OTHER): Payer: Self-pay | Admitting: Vascular Surgery

## 2020-07-09 ENCOUNTER — Ambulatory Visit (INDEPENDENT_AMBULATORY_CARE_PROVIDER_SITE_OTHER): Payer: Medicare Other | Admitting: Vascular Surgery

## 2020-07-09 VITALS — BP 143/69 | HR 61 | Resp 16 | Wt 173.0 lb

## 2020-07-09 DIAGNOSIS — R600 Localized edema: Secondary | ICD-10-CM | POA: Diagnosis not present

## 2020-07-09 DIAGNOSIS — E119 Type 2 diabetes mellitus without complications: Secondary | ICD-10-CM

## 2020-07-09 DIAGNOSIS — I83813 Varicose veins of bilateral lower extremities with pain: Secondary | ICD-10-CM | POA: Diagnosis not present

## 2020-07-09 DIAGNOSIS — M7989 Other specified soft tissue disorders: Secondary | ICD-10-CM

## 2020-07-09 DIAGNOSIS — I10 Essential (primary) hypertension: Secondary | ICD-10-CM

## 2020-07-09 NOTE — Assessment & Plan Note (Signed)
Much improved with laser ablation of both great saphenous veins.  Still wearing her compression stockings and doing well.  At this point, return as needed

## 2020-07-09 NOTE — Progress Notes (Signed)
MRN : [id number]  Rebekah Lowery is a 87 y.o. ([birth year]) female who presents with chief complaint of  Chief Complaint  Patient presents with   Follow-up    3 month follow up  .  History of Present Illness: Patient returns today in follow up of her leg swelling and venous insufficiency.  Her legs are doing quite well.  She has some very mild swelling which is well controlled with elevation and compression socks at this point.  Her legs are feeling much better after laser ablation of both great saphenous veins in a staged fashion several months ago.  Current Outpatient Medications  Medication Sig Dispense Refill   Acetaminophen (TYLENOL) 325 MG CAPS Tylenol     Cholecalciferol (D3-1000) 25 MCG (1000 UT) capsule Take 1,000 Units by mouth daily.     glipiZIDE (GLUCOTROL XL) 5 MG 24 hr tablet Take 5 mg by mouth daily with breakfast.     Iron-Vitamins (GERITOL COMPLETE PO) Geritol Complete 16 mg iron-0.38 mg tablet     lisinopril (PRINIVIL,ZESTRIL) 5 MG tablet Take 5 mg by mouth daily.  1   Omega-3 Fatty Acids (FISH OIL) 1000 MG CAPS Take by mouth.     vitamin B-12 (CYANOCOBALAMIN) 500 MCG tablet Take 500 mcg by mouth daily.     No current facility-administered medications for this visit.    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   MVP (mitral valve prolapse)    PAD (peripheral artery disease) ([place])    Stroke ([place]) 04/15/09    Past Surgical History:  Procedure Laterality Date   ABDOMINAL HYSTERECTOMY     BREAST SURGERY  01/29/1990   mastectomy of left   BREAST SURGERY  09/12/2005   partial mastectomy of right   BREAST SURGERY  2013   Right Mastectomy    CATARACT EXTRACTION W/PHACO Right 08/03/2014   Procedure: CATARACT EXTRACTION PHACO AND INTRAOCULAR LENS PLACEMENT ([place]);  Surgeon: Estill Cotta, MD;  Location: [facility] ORS;  Service: Ophthalmology;  Laterality: Right;  [place]: 01:17 AP%: 24.0 CDE: 32.46  Fluid Lot# [id number]    COLONOSCOPY  2010   EYE SURGERY     LAPAROSCOPIC HYSTERECTOMY  1982   MASTECTOMY     BIL   TONSILLECTOMY     WRIST SURGERY       Social History   Tobacco Use   Smoking status: Never   Smokeless tobacco: Never  Substance Use Topics   Alcohol use: No    Alcohol/week: 0.0 standard drinks   Drug use: No      Family History  Problem Relation Age of Onset   Cancer Sister        breast   Cancer Maternal Aunt        breast     Allergies  Allergen Reactions   Ibuprofen    Other    Aspirin Swelling and Other (See Comments)    Swelling of lips, caused pain also.   Citric Acid Swelling and Rash    Swelling all over the body.   Codeine Itching, Swelling and Rash    Swelling was all  over the body.   Lemon Juice Swelling and Rash    Allergic to lemons in general.   Lemon Oil Rash and Swelling    Allergic to lemons in general.   Monosodium Glutamate Other (See Comments)    Causes spike in bp, nausea, constipation, gi upset.   Peanut-Containing Drug Products Nausea Only    REVIEW OF SYSTEMS (Negative unless checked)   Constitutional: _0 Weight loss  _1 Fever  _2 Chills Cardiac: _3 Chest pain   _4 Chest pressure   _5 Palpitations   _6 Shortness of breath when laying flat   _7 Shortness of breath at rest   _8 Shortness of breath with exertion. Vascular:  _9 Pain in legs with walking   _10 Pain in legs at rest   _11 Pain in legs when laying flat   _12 Claudication   _13 Pain in feet when walking  _14 Pain in feet at rest  _15 Pain in feet when laying flat   _16 History of DVT   _17 Phlebitis   _18 Swelling in legs   _19 Varicose veins   _20 Non-healing ulcers Pulmonary:   _21 Uses home oxygen   _22 Productive cough   _23 Hemoptysis    _24 Wheeze  _25 COPD   _26 Asthma Neurologic:  _27 Dizziness  _28 Blackouts   _29 Seizures   _30 History of stroke   _31 History of TIA  _32 Aphasia   _33 Temporary blindness   _34 Dysphagia   _35 Weakness or numbness in arms   _36 Weakness or numbness in legs Musculoskeletal:  _37 Arthritis   _38 Joint swelling   _39 Joint pain   _40 Low back pain Hematologic:  _41 Easy bruising  _42 Easy bleeding   _43 Hypercoagulable state   _44 Anemic   Gastrointestinal:  _45 Blood in stool   _46 Vomiting blood  _47 Gastroesophageal reflux/heartburn   _48 Abdominal pain Genitourinary:  _49 Chronic kidney disease   _50 Difficult urination  _51 Frequent urination  _52 Burning with urination   _53 Hematuria Skin:  _54 Rashes   _55 Ulcers   _56 Wounds Psychological:  _57 History of anxiety   _58  History of major depression.  Physical Examination  BP (!) 143/69 (BP Location: Right Arm)   Pulse 61   Resp 16   Wt 173 lb (78.5 kg)   BMI 26.30 kg/m  Gen:  WD/WN, NAD Head: [place]/AT, No temporalis wasting. Ear/Nose/Throat: Hearing grossly intact, nares w/o erythema or drainage Eyes: Conjunctiva clear. Sclera non-icteric Neck: Supple.  Trachea midline Pulmonary:  Good air movement, no use of accessory muscles.  Cardiac: RRR, no JVD Vascular:  Vessel Right Left  Radial Palpable Palpable           Musculoskeletal: M/S 5/5 throughout.  No deformity or atrophy.  Trace lower extremity edema. Neurologic: Sensation grossly intact in extremities.  Symmetrical.  Speech is fluent.  Psychiatric: Judgment intact, Mood & affect appropriate for pt's clinical situation. Dermatologic: No rashes or ulcers noted.  No cellulitis or open wounds.      Labs No results found for this or any previous visit (from the past 2160 hour(s)).  Radiology No results found.  Assessment/Plan Essential hypertension blood pressure control important in reducing the progression of atherosclerotic disease. On appropriate oral medications.     Swelling of limb Improved but not entirely resolved  after laser ablation of the great saphenous vein bilaterally.  Compression socks have helped as well   Diabetes (HCC) blood glucose control important in reducing the progression of atherosclerotic disease. Also, involved in wound healing. On appropriate medications.  Varicose veins of leg with pain, bilateral Much improved with laser ablation of both great saphenous veins.  Still wearing her compression stockings and doing well.  At this point, return as needed  Leotis Pain, MD  07/09/2020 10:46 AM    This note was created with Dragon medical transcription system.  Any errors from dictation are purely unintentional

## 2020-08-12 ENCOUNTER — Encounter: Payer: Self-pay | Admitting: Orthopedic Surgery

## 2020-08-12 ENCOUNTER — Other Ambulatory Visit: Payer: Self-pay | Admitting: Orthopedic Surgery

## 2020-08-12 NOTE — H&P (Signed)
NAME: Rebekah Lowery MRN:   [id number] DOB:   10-17-1933     HISTORY AND PHYSICAL  CHIEF COMPLAINT:  left hip pain  HISTORY:   Rebekah Lowery a 87 y.o. female  with left  Hip Pain Patient complains of left hip pain. Onset of the symptoms was several years ago. Inciting event: known DJD. The patient reports the hip pain is worse with weight bearing. Associated symptoms: none. Aggravating symptoms include: any weight bearing. Patient has had prior hip problems. Previous visits for this problem: yes, last seen several weeks ago by me. Evaluation to date: plain films, which were abnormal  severe osteoarthritis .  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   MVP (mitral valve prolapse)    PAD (peripheral artery disease) ([place])    Stroke ([place]) 04/15/09    PAST SURGICAL HISTORY:   Past Surgical History:  Procedure Laterality Date   ABDOMINAL HYSTERECTOMY     BREAST SURGERY  01/29/1990   mastectomy of left   BREAST SURGERY  09/12/2005   partial mastectomy of right   BREAST SURGERY  2013   Right Mastectomy   CATARACT EXTRACTION W/PHACO Right 08/03/2014   Procedure: CATARACT EXTRACTION PHACO AND INTRAOCULAR LENS PLACEMENT ([place]);  Surgeon: Estill Cotta, MD;  Location: [facility] ORS;  Service: Ophthalmology;  Laterality: Right;  [place]: 01:17 AP%: 24.0 CDE: 32.46  Fluid Lot# [id number] H    COLONOSCOPY  2010   EYE SURGERY     LAPAROSCOPIC HYSTERECTOMY  1982   MASTECTOMY     BIL   TONSILLECTOMY     WRIST SURGERY      MEDICATIONS:  (Not in a hospital admission)   ALLERGIES:   Allergies  Allergen Reactions    Ibuprofen    Other    Aspirin Swelling and Other (See Comments)    Swelling of lips, caused pain also.   Citric Acid Swelling and Rash    Swelling all over the body.   Codeine Itching, Swelling and Rash    Swelling was all over the body.   Lemon Juice Swelling and Rash    Allergic to lemons in general.   Lemon Oil Rash and Swelling    Allergic to lemons in general.   Monosodium Glutamate Other (See Comments)    Causes spike in bp, nausea, constipation, gi upset.   Peanut-Containing Drug Products Nausea Only    REVIEW OF SYSTEMS:   Negative except HPI  FAMILY HISTORY:   Family History  Problem Relation Age of Onset   Cancer Sister        breast   Cancer Maternal Aunt        breast    SOCIAL HISTORY:   reports that she has never smoked. She has never used smokeless tobacco. She reports that she does not drink alcohol and does not use drugs.  PHYSICAL EXAM:  General appearance: alert, cooperative, and no distress Neck: no JVD and supple, symmetrical, trachea midline Resp: clear to auscultation bilaterally Cardio: regular rate and rhythm, S1, S2 normal, no murmur,  click, rub or gallop    LABORATORY STUDIES: No results for input(s): WBC, HGB, HCT, PLT in the last 72 hours.  No results for input(s): NA, K, CL, CO2, GLUCOSE, BUN, CREATININE, CALCIUM in the last 72 hours.  STUDIES/RESULTS:  No results found.  ASSESSMENT:  End stage osteoarthritis left hip        Active Problems:   * No active hospital problems. *    PLAN:  Left Primary Total Hip   Carlynn Spry 08/12/2020. 2:58 PM

## 2020-10-06 ENCOUNTER — Other Ambulatory Visit: Payer: Self-pay

## 2020-10-06 ENCOUNTER — Other Ambulatory Visit
Admission: RE | Admit: 2020-10-06 | Discharge: 2020-10-06 | Disposition: A | Discharge status: Home / Self Care | Payer: Medicare Other | Source: Ambulatory Visit | Attending: Orthopedic Surgery | Admitting: Orthopedic Surgery

## 2020-10-06 NOTE — Patient Instructions (Addendum)
Your procedure is scheduled on: Monday October 18, 2020. Report to Day Surgery inside [facility] 2nd floor. To find out your arrival time please call [phone] between 1PM - 3PM on Friday October 15, 2020.  Remember: Instructions that are not followed completely may result in serious medical risk,  up to and including death, or upon the discretion of your surgeon and anesthesiologist your  surgery may need to be rescheduled.     _X__ 1. Do not eat food or drink fluids after midnight the night before your procedure.                 No chewing gum or hard candies.   __X__2.  On the morning of surgery brush your teeth with toothpaste and water, you                may rinse your mouth with mouthwash if you wish.  Do not swallow any toothpaste or mouthwash.     _X__ 3.  No Alcohol for 24 hours before or after surgery.   _X__ 4.  Do Not Smoke or use e-cigarettes For 24 Hours Prior to Your Surgery.                 Do not use any chewable tobacco products for at least 6 hours prior to                 Surgery.  _X__  5.  Do not use any recreational drugs (marijuana, cocaine, heroin, ecstasy, MDMA or other)                For at least one week prior to your surgery.  Combination of these drugs with anesthesia                May have life threatening results.  __X__6.  Notify your doctor if there is any change in your medical condition      (cold, fever, infections).     Do not wear jewelry, make-up, hairpins, clips or nail polish. Do not wear lotions, powders, or perfumes. You may wear deodorant. Do not shave 48 hours prior to surgery. Men may shave face and neck. Do not bring valuables to the hospital.    [facility] is not responsible for any belongings or valuables.  Contacts, dentures or bridgework may not be worn into surgery. Leave your suitcase in the car. After surgery it may be brought to your room. For patients admitted to the hospital, discharge time  is determined by your treatment team.   Patients discharged the day of surgery will not be allowed to drive home.   Make arrangements for someone to be with you for the first 24 hours of your Same Day Discharge.    Please read over the following fact sheets that you were given:   Total Joint Packet    __X__ Take these medicines the morning of surgery with A SIP OF WATER:    1. None   2.   3.   4.  5.  6.  ____ Fleet Enema (as directed)   __X__ Use CHG Soap (or wipes) as directed  ____ Use Benzoyl Peroxide Gel as instructed  ____ Use inhalers on the day of surgery  ____ Stop metformin 2 days prior to surgery    ____ Take 1/2 of usual insulin dose the night before surgery. No insulin the morning          of surgery.   ____  Call your PCP, cardiologist, or Pulmonologist if taking Coumadin/Plavix/aspirin and ask when to stop before your surgery.   __X__ One Week prior to surgery- Stop Anti-inflammatories such as Ibuprofen, Aleve, Advil, Motrin, meloxicam (MOBIC), diclofenac, etodolac, ketorolac, Toradol, Daypro, piroxicam, Goody's or BC powders. OK TO USE TYLENOL IF NEEDED   __X__ Stop supplements until after surgery.    ____ Bring C-Pap to the hospital.    If you have any questions regarding your pre-procedure instructions,  Please call Pre-admit Testing at [phone].

## 2020-10-07 ENCOUNTER — Other Ambulatory Visit
Admission: RE | Admit: 2020-10-07 | Discharge: 2020-10-07 | Disposition: A | Discharge status: Home / Self Care | Payer: Medicare Other | Source: Ambulatory Visit | Attending: Orthopedic Surgery | Admitting: Orthopedic Surgery

## 2020-10-07 DIAGNOSIS — I10 Essential (primary) hypertension: Secondary | ICD-10-CM | POA: Diagnosis not present

## 2020-10-07 DIAGNOSIS — Z7984 Long term (current) use of oral hypoglycemic drugs: Secondary | ICD-10-CM | POA: Insufficient documentation

## 2020-10-07 DIAGNOSIS — Z8673 Personal history of transient ischemic attack (TIA), and cerebral infarction without residual deficits: Secondary | ICD-10-CM | POA: Insufficient documentation

## 2020-10-07 DIAGNOSIS — I341 Nonrheumatic mitral (valve) prolapse: Secondary | ICD-10-CM | POA: Diagnosis not present

## 2020-10-07 DIAGNOSIS — M1612 Unilateral primary osteoarthritis, left hip: Secondary | ICD-10-CM | POA: Insufficient documentation

## 2020-10-07 DIAGNOSIS — Z01818 Encounter for other preprocedural examination: Secondary | ICD-10-CM | POA: Diagnosis not present

## 2020-10-07 DIAGNOSIS — E1151 Type 2 diabetes mellitus with diabetic peripheral angiopathy without gangrene: Secondary | ICD-10-CM | POA: Insufficient documentation

## 2020-10-07 LAB — BASIC METABOLIC PANEL
Anion gap: 8 (ref 5–15)
BUN: 21 mg/dL (ref 8–23)
CO2: 29 mmol/L (ref 22–32)
Calcium: 9 mg/dL (ref 8.9–10.3)
Chloride: 102 mmol/L (ref 98–111)
Creatinine, Ser: 1.06 mg/dL — ABNORMAL HIGH (ref 0.44–1.00)
GFR, Estimated: 51 mL/min — ABNORMAL LOW (ref >60)
Glucose, Bld: 239 mg/dL — ABNORMAL HIGH (ref 70–99)
Potassium: 3.6 mmol/L (ref 3.5–5.1)
Sodium: 139 mmol/L (ref 135–145)

## 2020-10-07 LAB — URINALYSIS, ROUTINE W REFLEX MICROSCOPIC
Bilirubin Urine: NEGATIVE
Glucose, UA: NEGATIVE mg/dL
Hgb urine dipstick: NEGATIVE
Ketones, ur: NEGATIVE mg/dL
Leukocytes,Ua: NEGATIVE
Nitrite: POSITIVE — AB
Protein, ur: NEGATIVE mg/dL
Specific Gravity, Urine: 1.015 (ref 1.005–1.030)
pH: 5 (ref 5.0–8.0)

## 2020-10-07 LAB — PROTIME-INR
INR: 1 (ref 0.8–1.2)
Prothrombin Time: 13.2 seconds (ref 11.4–15.2)

## 2020-10-07 LAB — CBC
HCT: 36.1 % (ref 36.0–46.0)
Hemoglobin: 11.7 g/dL — ABNORMAL LOW (ref 12.0–15.0)
MCH: 26.2 pg (ref 26.0–34.0)
MCHC: 32.4 g/dL (ref 30.0–36.0)
MCV: 80.9 fL (ref 80.0–100.0)
Platelets: 257 10*3/uL (ref 150–400)
RBC: 4.46 MIL/uL (ref 3.87–5.11)
RDW: 14.7 % (ref 11.5–15.5)
WBC: 7.6 10*3/uL (ref 4.0–10.5)
nRBC: 0 % (ref 0.0–0.2)

## 2020-10-07 LAB — SURGICAL PCR SCREEN
MRSA, PCR: NEGATIVE
Staphylococcus aureus: NEGATIVE

## 2020-10-07 LAB — APTT: aPTT: 25 seconds (ref 24–36)

## 2020-10-07 LAB — TYPE AND SCREEN
ABO/RH(D): AB POS
Antibody Screen: NEGATIVE

## 2020-10-07 NOTE — Progress Notes (Signed)
  Perioperative Services Pre-Admission/Anesthesia Testing    Date: 10/07/20  Name: Rebekah Lowery MRN:   [id number]  Re: EKG changes and need for cardiac evaluation  Planned Surgical Procedure(s):    Case: [id number] Date/Time: 10/18/20 1447   Procedure: TOTAL HIP ARTHROPLASTY ANTERIOR APPROACH (Left: Hip)   Anesthesia type: Spinal   Pre-op diagnosis: M16.12 Unilateral primary osteoarthritis, left hip   Location: [facility] OR ROOM 02 / [place] [facility] AN[facility]   Surgeons: Lovell Sheehan, MD   Clinical Notes:  Patient is scheduled for the above procedure on 10/18/2020 with Dr. Kurtis Bushman, MD.  In preparation for her procedure, patient presented to the PAT clinic on the morning of 10/07/2020 for labs and ECG testing.  In review of her ECG, it was noted that patient has presumably acute TWIs noted in leads V2-V5.  Of note, this is an acute finding when compared to tracing performed on 03/31/2017.  LVH also noted on the ECG tracing. Repolorization abnormality may be contributing to the noted changes. I did not see patient in clinic to be able to speak to whether or not patient is experiencing any angina/anginal equivalent symptoms.    In brief review of patient's EMR, patient has cardiac risk factors including HTN, T2DM, PAD, CVA, MVP, and African-American race.  I do not see a record of any recent cardiovascular testing nor evidence the patient has seen a cardiologist in the recent past.  Impression and Plan:  [name] presented to the PAT clinic on 10/07/2020 with presumably new TWI's in leads V2-V5.  Patient had [place] on last ECG without the presence of today's noted changes. In efforts to ensure that patient is able to safely undergo her planned hip surgery, will refer to cardiology for further evaluation and preoperative clearance.  Referral order entered to [facility]; will follow up on scheduling. Additionally, a copy of this communication was forwarded to primary  attending surgeons office to make him aware of the noted ECG changes and plans to have this patient seen and evaluated by cardiology prior to her procedure.  No changes are being made to the surgical schedule at this time.  Orders Placed This Encounter  Procedures   Ambulatory referral to Cardiology    Referral Priority:   Routine    Referral Type:   Consultation    Referral Reason:   Specialty Services Required --Anterior TWIs noted on preoperative ECG.  --History of MVP.  --No local cardiologist.  --No recent cardiovascular testing.     Requested Specialty:   Cardiology    Number of Visits Requested:   [place], MSN, APRN, FNP-C, CEN [facility]  Peri-operative Services Nurse Practitioner Phone: [phone] 10/07/20 12:10 PM  NOTE: This note has been prepared using Dragon dictation software. Despite my best ability to proofread, there is always the potential that unintentional transcriptional errors may still occur from this process.

## 2020-10-08 ENCOUNTER — Encounter: Payer: Self-pay | Admitting: Cardiology

## 2020-10-08 ENCOUNTER — Other Ambulatory Visit: Payer: Self-pay

## 2020-10-08 ENCOUNTER — Ambulatory Visit (INDEPENDENT_AMBULATORY_CARE_PROVIDER_SITE_OTHER): Payer: Medicare Other | Admitting: Cardiology

## 2020-10-08 VITALS — BP 128/70 | HR 61 | Ht 68.0 in | Wt 170.0 lb

## 2020-10-08 DIAGNOSIS — Z8679 Personal history of other diseases of the circulatory system: Secondary | ICD-10-CM

## 2020-10-08 DIAGNOSIS — Z01818 Encounter for other preprocedural examination: Secondary | ICD-10-CM | POA: Diagnosis not present

## 2020-10-08 DIAGNOSIS — I10 Essential (primary) hypertension: Secondary | ICD-10-CM

## 2020-10-08 LAB — HEMOGLOBIN A1C
Hgb A1c MFr Bld: 8.7 % — ABNORMAL HIGH (ref 4.8–5.6)
Mean Plasma Glucose: 203 mg/dL

## 2020-10-08 NOTE — Patient Instructions (Signed)
Medication Instructions:   Your physician recommends that you continue on your current medications as directed. Please refer to the Current Medication list given to you today.  *If you need a refill on your cardiac medications before your next appointment, please call your pharmacy*   Lab Work: None ordered If you have labs (blood work) drawn today and your tests are completely normal, you will receive your results only by: [place] (if you have MyChart) OR A paper copy in the mail If you have any lab test that is abnormal or we need to change your treatment, we will call you to review the results.   Testing/Procedures:  Your physician has requested that you have an echocardiogram. Echocardiography is a painless test that uses sound waves to create images of your heart. It provides your doctor with information about the size and shape of your heart and how well your heart's chambers and valves are working. This procedure takes approximately one hour. There are no restrictions for this procedure.    Follow-Up: At [facility], you and your health needs are our priority.  As part of our continuing mission to provide you with exceptional heart care, we have created designated Provider Care Teams.  These Care Teams include your primary Cardiologist (physician) and Advanced Practice Providers (APPs -  Physician Assistants and Nurse Practitioners) who all work together to provide you with the care you need, when you need it.  We recommend signing up for the patient portal called "MyChart".  Sign up information is provided on this After Visit Summary.  MyChart is used to connect with patients for Virtual Visits (Telemedicine).  Patients are able to view lab/test results, encounter notes, upcoming appointments, etc.  Non-urgent messages can be sent to your provider as well.   To learn more about what you can do with MyChart, go to [email or web address].    Your next appointment:    Follow up as needed   The format for your next appointment:   In Person  Provider:   You may see Dr. Garen Lah or one of the following Advanced Practice Providers on your designated Care Team:   Murray Hodgkins, NP Christell Faith, PA-C Marrianne Mood, PA-C Cadence Kathlen Mody, [place]   Other Instructions

## 2020-10-08 NOTE — Progress Notes (Signed)
Cardiology Office Note:    Date:  10/08/2020   ID:  Rebekah Lowery, DOB October 08, 1933, MRN [id number]  PCP:  Casilda Carls, MD   [facility] Providers Cardiologist:  None     Referring MD: Casilda Carls, MD   Chief Complaint  Patient presents with   New Patient (Initial Visit)    Needs preoperative evaluation and clearance visit prior to hip surgery on 10/18/2020. Meds reviewed verbally with patient.     History of Present Illness:    Rebekah Lowery is a 87 y.o. female with a hx of hypertension, diabetes, mitral valve prolapse, CVA 2011, venous insufficiency s/p bilateral great saphenous vein ablation, presenting for preop evaluation.  Patient has worsening left hip pain, evaluated by orthopedic surgery, left hip replacement is being planned.  She denies history of MI, heart failure.  Denies chest pain or shortness of breath at rest or with ambulation.  Ambulation/activity is limited mainly by hip discomfort.  States being told having mitral valve prolapse in the past.  Takes medications as prescribed.  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   MVP (mitral valve prolapse)    PAD (peripheral artery disease) ([place])    Stroke ([place]) 04/15/09    Past Surgical History:  Procedure Laterality Date   ABDOMINAL HYSTERECTOMY     BREAST SURGERY  01/29/1990   mastectomy of left   BREAST SURGERY  09/12/2005   partial mastectomy of right   BREAST SURGERY  2013   Right Mastectomy   CATARACT EXTRACTION W/PHACO Right 08/03/2014   Procedure: CATARACT EXTRACTION PHACO AND INTRAOCULAR LENS PLACEMENT (IOC);  Surgeon: Estill Cotta, MD;  Location: [facility] ORS;  Service:  Ophthalmology;  Laterality: Right;  [place]: 01:17 AP%: 24.0 CDE: 32.46  Fluid Lot# [id number]    COLONOSCOPY  2010   EYE SURGERY     LAPAROSCOPIC HYSTERECTOMY  1982   MASTECTOMY     BIL   TONSILLECTOMY     Venacure for Vericose vein  03/2020   WRIST SURGERY      Current Medications: Current Meds  Medication Sig   acetaminophen (TYLENOL) 500 MG tablet Take 500 mg by mouth every 6 (six) hours as needed for moderate pain or mild pain.   cyanocobalamin 1000 MCG tablet Take 1,000 mcg by mouth daily.   glipiZIDE (GLUCOTROL XL) 5 MG 24 hr tablet Take 5 mg by mouth daily with breakfast.   hydrochlorothiazide (HYDRODIURIL) 25 MG tablet Take 25 mg by mouth daily.   lisinopril (ZESTRIL) 10 MG tablet Take 10 mg by mouth daily.     Allergies:   Ibuprofen, Other, Aspirin, Citric acid, Codeine, Lemon juice, Lemon oil, Monosodium glutamate, and Peanut-containing drug products   Social History   Socioeconomic History   Marital status: Divorced    Spouse name: Not on file   Number of children: Not on file   Years of education: Not on file   Highest education level: Not on file  Occupational History   Not on file  Tobacco Use   Smoking status: Never   Smokeless tobacco: Never  Vaping Use   Vaping  Use: Never used  Substance and Sexual Activity   Alcohol use: No    Alcohol/week: 0.0 standard drinks   Drug use: No   Sexual activity: Not on file  Other Topics Concern   Not on file  Social History Narrative   Not on file   Social Determinants of Health   Financial Resource Strain: Not on file  Food Insecurity: Not on file  Transportation Needs: Not on file  Physical Activity: Not on file  Stress: Not on file  Social Connections: Not on file     Family History: The patient's family history includes Cancer in her maternal aunt and sister.  ROS:   Please see the history of present illness.     All other systems reviewed and are negative.  EKGs/Labs/Other Studies Reviewed:    The  following studies were reviewed today:   EKG:  EKG is  ordered today.  The ekg ordered today demonstrates normal sinus rhythm, inferior and anterolateral T wave changes.  Recent Labs: 10/07/2020: BUN 21; Creatinine, Ser 1.06; Hemoglobin 11.7; Platelets 257; Potassium 3.6; Sodium 139  Recent Lipid Panel No results found for: CHOL, TRIG, HDL, CHOLHDL, VLDL, LDLCALC, LDLDIRECT   Risk Assessment/Calculations:         Physical Exam:    VS:  BP 128/70 (BP Location: Left Arm, Patient Position: Sitting, Cuff Size: Normal)   Pulse 61   Ht _0  (1.727 m)   Wt 170 lb (77.1 kg)   SpO2 95%   BMI 25.85 kg/m     Wt Readings from Last 3 Encounters:  10/08/20 170 lb (77.1 kg)  10/06/20 172 lb (78 kg)  07/09/20 173 lb (78.5 kg)     GEN:  Well nourished, well developed in no acute distress HEENT: Normal NECK: No JVD; No carotid bruits LYMPHATICS: No lymphadenopathy CARDIAC: RRR, no murmurs, rubs, gallops RESPIRATORY:  Clear to auscultation without rales, wheezing or rhonchi  ABDOMEN: Soft, non-tender, non-distended MUSCULOSKELETAL:  No edema; No deformity  SKIN: Warm and dry NEUROLOGIC:  Alert and oriented x 3 PSYCHIATRIC:  Normal affect   ASSESSMENT:    1. Pre-op evaluation   2. Primary hypertension   3. H/O mitral valve prolapse    PLAN:    In order of problems listed above:  Preop evaluation, denies chest pain or shortness of breath.  No history of MI or heart failure.  Obtain echocardiogram due to history of MVP to evaluate any significant regurgitation.  If echo is otherwise unremarkable, patient can undergo procedure from a cardiac perspective without any additional testing. Hypertension, BP controlled.  Continue HCTZ, lisinopril. History of mitral valve prolapse, obtain echocardiogram to evaluate valvular pathology.  Follow-up after echo as needed if gross pathology is noted on echo.    Medication Adjustments/Labs and Tests Ordered: Current medicines are reviewed at  length with the patient today.  Concerns regarding medicines are outlined above.  Orders Placed This Encounter  Procedures   EKG 12-Lead   ECHOCARDIOGRAM COMPLETE    No orders of the defined types were placed in this encounter.   Patient Instructions  Medication Instructions:   Your physician recommends that you continue on your current medications as directed. Please refer to the Current Medication list given to you today.  *If you need a refill on your cardiac medications before your next appointment, please call your pharmacy*   Lab Work: None ordered If you have labs (blood work) drawn today and your tests are completely normal, you will receive your  results only by: MyChart Message (if you have MyChart) OR A paper copy in the mail If you have any lab test that is abnormal or we need to change your treatment, we will call you to review the results.   Testing/Procedures:  Your physician has requested that you have an echocardiogram. Echocardiography is a painless test that uses sound waves to create images of your heart. It provides your doctor with information about the size and shape of your heart and how well your heart's chambers and valves are working. This procedure takes approximately one hour. There are no restrictions for this procedure.    Follow-Up: At [facility], you and your health needs are our priority.  As part of our continuing mission to provide you with exceptional heart care, we have created designated Provider Care Teams.  These Care Teams include your primary Cardiologist (physician) and Advanced Practice Providers (APPs -  Physician Assistants and Nurse Practitioners) who all work together to provide you with the care you need, when you need it.  We recommend signing up for the patient portal called "MyChart".  Sign up information is provided on this After Visit Summary.  MyChart is used to connect with patients for Virtual Visits (Telemedicine).   Patients are able to view lab/test results, encounter notes, upcoming appointments, etc.  Non-urgent messages can be sent to your provider as well.   To learn more about what you can do with MyChart, go to [email or web address].    Your next appointment:   Follow up as needed   The format for your next appointment:   In Person  Provider:   You may see Dr. Garen Lah or one of the following Advanced Practice Providers on your designated Care Team:   Murray Hodgkins, NP Christell Faith, PA-C Marrianne Mood, PA-C Cadence Wendell, [place]   Other Instructions    Signed, Kate Sable, MD  10/08/2020 1:05 PM    [place]

## 2020-10-09 ENCOUNTER — Encounter: Payer: Self-pay | Admitting: Urgent Care

## 2020-10-09 LAB — URINE CULTURE: Culture: >=100000 — AB

## 2020-10-09 NOTE — Progress Notes (Addendum)
  Perioperative Services: Pre-Admission/Anesthesia Testing  Abnormal Lab Notification    Date: 10/09/20  Name: Rebekah Lowery MRN:   [id number]  Re: Abnormal labs noted during PAT appointment   Provider(s) Notified: Kurtis Bushman, MD Notification mode: Routed and/or faxed via [facility]   ABNORMAL LAB VALUE(S): Lab Results  Component Value Date   GLUCOSE 239 (H) 10/07/2020    Notes: Patient with a T2DM diagnosis. She is currently on oral (glipizide) therapy. Last Hgb A1c was 8.7% on 10/07/2020. This communication is being sent in order to determine if patient is deemed to have adequate preoperative glycemic control in efforts to reduce her risk of developing postoperative SSI.  The odds ratio for SSI/PJI infection is between 2.8 and 3.4 for orthopedic surgery patients with pre-operative serum glucose levels of > 125 mg/dL or a post-operative levels of > 200 mg/dL ([place], 2019).    Studies show that Hgb A1c at a threshold of 7.7 was distinct for predicting SSI/PJI. With that being said, a Hgb A1c threshold of 7.7% tends to be more indicative of infection than the commonly used 7%, and should perhaps be the pre-operative patient optimization goal Carolin Guernsey et al., 2017).   With that being said, the benefit of improving glycemic control must be weighed against the overall risk associated with delaying a necessary elective orthopedic surgery for this patient. This is a Community education officer; no formal response is required.  Citations: Charlett Blake, A.F. Reducing the risk of infection after total joint arthroplasty: preoperative optimization. Arthroplasty 1, 4 (2019). [email or web address]  Lorrin Goodell MM, [place], Brigati D, [place] SM, [address], [place] JC, [place], [place], [place], Parvizi Lenna Sciara [place]. Determining the Threshold for HbA1c as a Predictor for Adverse Outcomes After Total Joint Arthroplasty: A Multicenter, Retrospective  Study. J Arthroplasty. 2017 Sep;32(9S):S263-S267.e1. [email or web address].   Honor Loh, MSN, APRN, FNP-C, CEN [facility]  Peri-operative Services Nurse Practitioner Phone: [phone] 10/09/20 6:15 PM

## 2020-10-10 ENCOUNTER — Telehealth: Payer: Self-pay | Admitting: Urgent Care

## 2020-10-10 DIAGNOSIS — Z01818 Encounter for other preprocedural examination: Secondary | ICD-10-CM

## 2020-10-10 DIAGNOSIS — B9620 Unspecified Escherichia coli [E. coli] as the cause of diseases classified elsewhere: Secondary | ICD-10-CM

## 2020-10-10 MED ORDER — SULFAMETHOXAZOLE-TRIMETHOPRIM 800-160 MG PO TABS: 1.0000 | ORAL_TABLET | Freq: Two times a day (BID) | ORAL | 0 refills | Status: AC

## 2020-10-10 NOTE — Progress Notes (Addendum)
  [facility] Perioperative Services: Pre-Admission/Anesthesia Testing  Abnormal Lab Notification and Treatment Plan of Care   Date: 10/10/20  Name: Rebekah Lowery MRN:   [id number]  Re: Abnormal labs noted during PAT appointment   Provider(s) Notified: Kurtis Bushman, MD Notification mode: Routed and/or faxed via [facility] LAB VALUE(S): Lab Results  Component Value Date   COLORURINE YELLOW (A) 10/07/2020   APPEARANCEUR HAZY (A) 10/07/2020   LABSPEC 1.015 10/07/2020   PHURINE 5.0 10/07/2020   GLUCOSEU NEGATIVE 10/07/2020   HGBUR NEGATIVE 10/07/2020   BILIRUBINUR NEGATIVE 10/07/2020   KETONESUR NEGATIVE 10/07/2020   PROTEINUR NEGATIVE 10/07/2020   NITRITE POSITIVE (A) 10/07/2020   LEUKOCYTESUR NEGATIVE 10/07/2020   EPIU 0-5 10/07/2020   WBCU 0-5 10/07/2020   RBCU 0-5 10/07/2020   BACTERIA RARE (A) 10/07/2020   Lab Results  Component Value Date   CULT >=100,000 COLONIES/mL ESCHERICHIA COLI (A) 10/07/2020    Clinical Notes:  Patient scheduled for an elective TOTAL HIP ARTHROPLASTY on 10/18/2020.    UA performed in PAT consistent with infection.  No leukocytosis noted on CBC; WBC 7600 Renal function: Estimated Creatinine Clearance: 42.4 mL/min (A) (by C-G formula based on SCr of 1.06 mg/dL (H)). Urine C&S added to assess for pathogenically significant growth.  Impression and Plan:  UA was (+) for infection. Reflex culture sent that grew out Escherichia coli. Contacted patient to discuss. I had some difficulty touching base with her over the weekend; able to speak with her on 10/10/2020 at around 1330 PM. Patient reporting that she is experiencing minor LUTS; mainly very minor dysuria with some associated frequency and urgency. She denied any associated nausea, vomiting, fevers, inability to void, or gross hematuria. Patient with elective orthopedic surgery scheduled soon. In efforts to avoid delaying patient's procedure, or having her experience  any postoperative complications, I would like to proceed with empiric treatment for urinary tract infection.  Allergies and sensitivity report reviewed. Will treat with a culture appropriate 3 day course of SMZ-TMP DS. Patient encouraged to complete the entire course of antibiotics even if she begins to feel better.    Meds ordered this encounter  Medications   sulfamethoxazole-trimethoprim (BACTRIM DS) 800-160 MG tablet    Sig: Take 1 tablet by mouth 2 (two) times daily for 3 days.    Dispense:  6 tablet    Refill:  0   Patient encouraged to increase her fluid intake as much as possible. Discussed that water is always best to flush the urinary tract. She was advised to avoid caffeine containing fluids until her infections clears, as caffeine can cause her to experience painful bladder spasms.   May use Tylenol as needed for pain/fever.   Results and treatment plan of care forwarded to primary attending surgeon to make them aware.   This is a Community education officer; no formal response is required.  Honor Loh, MSN, APRN, FNP-C, CEN [facility]  Peri-operative Services Nurse Practitioner Phone: [phone] Fax: [phone] 10/10/20 1:30 PM

## 2020-10-11 ENCOUNTER — Other Ambulatory Visit: Payer: Self-pay

## 2020-10-11 ENCOUNTER — Ambulatory Visit
Admission: RE | Admit: 2020-10-11 | Discharge: 2020-10-11 | Disposition: A | Discharge status: Home / Self Care | Payer: Medicare Other | Source: Ambulatory Visit | Attending: Cardiology | Admitting: Cardiology

## 2020-10-11 DIAGNOSIS — Z8673 Personal history of transient ischemic attack (TIA), and cerebral infarction without residual deficits: Secondary | ICD-10-CM | POA: Insufficient documentation

## 2020-10-11 DIAGNOSIS — Z8679 Personal history of other diseases of the circulatory system: Secondary | ICD-10-CM | POA: Diagnosis not present

## 2020-10-11 DIAGNOSIS — I739 Peripheral vascular disease, unspecified: Secondary | ICD-10-CM | POA: Diagnosis not present

## 2020-10-11 DIAGNOSIS — I341 Nonrheumatic mitral (valve) prolapse: Secondary | ICD-10-CM | POA: Insufficient documentation

## 2020-10-11 DIAGNOSIS — E119 Type 2 diabetes mellitus without complications: Secondary | ICD-10-CM | POA: Diagnosis not present

## 2020-10-11 DIAGNOSIS — I358 Other nonrheumatic aortic valve disorders: Secondary | ICD-10-CM | POA: Diagnosis not present

## 2020-10-11 DIAGNOSIS — I10 Essential (primary) hypertension: Secondary | ICD-10-CM | POA: Diagnosis not present

## 2020-10-11 DIAGNOSIS — I340 Nonrheumatic mitral (valve) insufficiency: Secondary | ICD-10-CM

## 2020-10-11 LAB — ECHOCARDIOGRAM COMPLETE
AR max vel: 1.94 cm2
AV Area VTI: 2.05 cm2
AV Area mean vel: 1.7 cm2
AV Mean grad: 4 mmHg
AV Peak grad: 7.1 mmHg
Ao pk vel: 1.33 m/s
Area-P 1/2: 3.39 cm2
MV VTI: 1.8 cm2
S' Lateral: 2.32 cm

## 2020-10-11 NOTE — Progress Notes (Signed)
*  PRELIMINARY RESULTS* Echocardiogram 2D Echocardiogram has been performed.  Sherrie Sport 10/11/2020, 11:27 AM

## 2020-10-12 NOTE — Progress Notes (Signed)
  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 Kurtis Bushman.  In preparation for procedure, patient presented to the PAT clinic for preoperative labs and ECG testing.  ECG was found to be abnormal.  Patient was referred to cardiology.  She was seen in consult on 10/08/2020 by Dr. Kate Sable; notes reviewed.  Patient denied any angina/anginal equivalent symptoms; no chest pain, shortness of breath, PND, orthopnea, palpitations, significant peripheral edema, vertiginous symptoms, or presyncope/syncope.  Blood pressure was well controlled at 128/70 on currently prescribed diuretic and ACEi therapies.  Patient was not taking any type of lipid-lowering medications for ASCVD prevention.  T2DM loosely controlled; last Hgb A1c was 8.7% when checked on 10/07/2020.  Given her history of MVP, cardiologist elected to perform TTE prior to issuing clearance.  Patient underwent TTE on 10/11/2020 revealing a normal left ventricular systolic function with mild LVH; LVEF 55-60%.  GLS was -17%.  There was mild mitral valve regurgitation, however no evidence of prolapse.  There was no evidence of valvular stenosis.  Study determined to be normal low risk.  Following the aforementioned TTE, cardiology has advised that, "based ACC/AHA guidelines, the patient's past medical history, and the amount of time since her last clinic visit, this patient would be at an overall ACCEPTABLE risk for the planned  procedure without further cardiovascular testing or intervention at this time".   Impression and Plan:  Rebekah Lowery has been seen in consultation by cardiology and has undergone noninvasive cardiovascular studies to further evaluate her MVP and noted ECG changes.  At this point, patient has been appropriately cleared to proceed with planned surgical intervention by cardiology with an overall ACCEPTABLE risk of significant perioperative cardiovascular complications.  Barring any significant acute changes in the patient's overall condition, it is anticipated that she will be able to proceed with the planned surgical intervention. Any acute changes in clinical condition may necessitate her procedure being postponed and/or cancelled. Patient will meet with anesthesia team (MD and/or CRNA) on this day of her procedure for preoperative evaluation/assessment.   Pre-surgical instructions were reviewed with the patient during her PAT appointment and questions were fielded by PAT clinical staff. Patient was advised that if any questions or concerns arise prior to her procedure then she should return a call to PAT and/or her surgeon's office to discuss.  Honor Loh, MSN, APRN, FNP-C, CEN [facility]  Peri-operative Services Nurse Practitioner Phone: [phone] 10/12/20 12:39 PM  NOTE: This note has been prepared using Dragon dictation software. Despite my best ability to proofread, there is always the potential that unintentional transcriptional errors may still occur from this process.

## 2020-10-14 ENCOUNTER — Other Ambulatory Visit
Admission: RE | Admit: 2020-10-14 | Discharge: 2020-10-14 | Disposition: A | Discharge status: Home / Self Care | Payer: Medicare Other | Source: Ambulatory Visit | Attending: Orthopedic Surgery | Admitting: Orthopedic Surgery

## 2020-10-14 ENCOUNTER — Other Ambulatory Visit: Payer: Self-pay

## 2020-10-14 DIAGNOSIS — Z01812 Encounter for preprocedural laboratory examination: Secondary | ICD-10-CM | POA: Diagnosis present

## 2020-10-14 DIAGNOSIS — Z20822 Contact with and (suspected) exposure to covid-19: Secondary | ICD-10-CM | POA: Diagnosis not present

## 2020-10-14 LAB — SARS CORONAVIRUS 2 (TAT 6-24 HRS): SARS Coronavirus 2: NEGATIVE

## 2020-10-17 MED ORDER — CEFAZOLIN SODIUM-DEXTROSE 2-4 GM/100ML-% IV SOLN
2.0000 g | INTRAVENOUS | Status: AC
  Administered 2020-10-18: 2 g via INTRAVENOUS

## 2020-10-17 MED ORDER — ORAL CARE MOUTH RINSE: 15.0000 mL | Freq: Once | OROMUCOSAL | Status: AC

## 2020-10-17 MED ORDER — CHLORHEXIDINE GLUCONATE 0.12 % MT SOLN: 15.0000 mL | Freq: Once | OROMUCOSAL | Status: AC

## 2020-10-17 MED ORDER — POVIDONE-IODINE 10 % EX SWAB
2.0000 "application " | Freq: Once | CUTANEOUS | Status: AC
  Administered 2020-10-18: 2 via TOPICAL

## 2020-10-17 MED ORDER — FAMOTIDINE 20 MG PO TABS: 20.0000 mg | ORAL_TABLET | Freq: Once | ORAL | Status: AC

## 2020-10-17 MED ORDER — SODIUM CHLORIDE 0.9 % IV SOLN: INTRAVENOUS | Status: DC

## 2020-10-18 ENCOUNTER — Encounter
Admission: RE | Disposition: A | Discharge status: Home Health | Payer: Self-pay | Source: Ambulatory Visit | Attending: Orthopedic Surgery

## 2020-10-18 ENCOUNTER — Other Ambulatory Visit: Payer: Self-pay

## 2020-10-18 ENCOUNTER — Encounter: Payer: Self-pay | Admitting: Orthopedic Surgery

## 2020-10-18 ENCOUNTER — Inpatient Hospital Stay: Payer: Medicare Other | Admitting: Urgent Care

## 2020-10-18 ENCOUNTER — Inpatient Hospital Stay: Payer: Medicare Other

## 2020-10-18 ENCOUNTER — Inpatient Hospital Stay
Admission: RE | Admit: 2020-10-18 | Discharge: 2020-10-20 | DRG: 470 | Disposition: A | Discharge status: Home Health | Payer: Medicare Other | Source: Ambulatory Visit | Attending: Orthopedic Surgery | Admitting: Orthopedic Surgery

## 2020-10-18 DIAGNOSIS — Z886 Allergy status to analgesic agent status: Secondary | ICD-10-CM | POA: Diagnosis not present

## 2020-10-18 DIAGNOSIS — Z91010 Allergy to peanuts: Secondary | ICD-10-CM | POA: Diagnosis not present

## 2020-10-18 DIAGNOSIS — I341 Nonrheumatic mitral (valve) prolapse: Secondary | ICD-10-CM | POA: Diagnosis present

## 2020-10-18 DIAGNOSIS — E1151 Type 2 diabetes mellitus with diabetic peripheral angiopathy without gangrene: Secondary | ICD-10-CM | POA: Diagnosis present

## 2020-10-18 DIAGNOSIS — Z8673 Personal history of transient ischemic attack (TIA), and cerebral infarction without residual deficits: Secondary | ICD-10-CM

## 2020-10-18 DIAGNOSIS — Z801 Family history of malignant neoplasm of trachea, bronchus and lung: Secondary | ICD-10-CM | POA: Diagnosis not present

## 2020-10-18 DIAGNOSIS — I10 Essential (primary) hypertension: Secondary | ICD-10-CM | POA: Diagnosis present

## 2020-10-18 DIAGNOSIS — M1612 Unilateral primary osteoarthritis, left hip: Principal | ICD-10-CM | POA: Diagnosis present

## 2020-10-18 DIAGNOSIS — Z7984 Long term (current) use of oral hypoglycemic drugs: Secondary | ICD-10-CM

## 2020-10-18 DIAGNOSIS — Z885 Allergy status to narcotic agent status: Secondary | ICD-10-CM | POA: Diagnosis not present

## 2020-10-18 DIAGNOSIS — Z96642 Presence of left artificial hip joint: Secondary | ICD-10-CM

## 2020-10-18 DIAGNOSIS — Z800 Family history of malignant neoplasm of digestive organs: Secondary | ICD-10-CM

## 2020-10-18 DIAGNOSIS — Z91018 Allergy to other foods: Secondary | ICD-10-CM | POA: Diagnosis not present

## 2020-10-18 DIAGNOSIS — Z853 Personal history of malignant neoplasm of breast: Secondary | ICD-10-CM

## 2020-10-18 DIAGNOSIS — Z419 Encounter for procedure for purposes other than remedying health state, unspecified: Secondary | ICD-10-CM

## 2020-10-18 HISTORY — PX: TOTAL HIP ARTHROPLASTY: SHX124

## 2020-10-18 LAB — ABO/RH: ABO/RH(D): AB POS

## 2020-10-18 LAB — CBC
HCT: 36.1 % (ref 36.0–46.0)
Hemoglobin: 12 g/dL (ref 12.0–15.0)
MCH: 27.6 pg (ref 26.0–34.0)
MCHC: 33.2 g/dL (ref 30.0–36.0)
MCV: 83 fL (ref 80.0–100.0)
Platelets: 255 10*3/uL (ref 150–400)
RBC: 4.35 MIL/uL (ref 3.87–5.11)
RDW: 15.1 % (ref 11.5–15.5)
WBC: 10.4 10*3/uL (ref 4.0–10.5)
nRBC: 0 % (ref 0.0–0.2)

## 2020-10-18 LAB — CREATININE, SERUM
Creatinine, Ser: 0.92 mg/dL (ref 0.44–1.00)
GFR, Estimated: >60 mL/min (ref >60)

## 2020-10-18 LAB — GLUCOSE, CAPILLARY
Glucose-Capillary: 157 mg/dL — ABNORMAL HIGH (ref 70–99)
Glucose-Capillary: 127 mg/dL — ABNORMAL HIGH (ref 70–99)

## 2020-10-18 SURGERY — ARTHROPLASTY, HIP, TOTAL, ANTERIOR APPROACH
Anesthesia: Spinal | Site: Hip | Laterality: Left

## 2020-10-18 MED ORDER — KETOROLAC TROMETHAMINE 15 MG/ML IJ SOLN
7.5000 mg | Freq: Four times a day (QID) | INTRAMUSCULAR | Status: AC
  Administered 2020-10-18 – 2020-10-19 (×4): 7.5 mg via INTRAVENOUS
  Filled 2020-10-18 (×4): qty 1

## 2020-10-18 MED ORDER — MAGNESIUM HYDROXIDE 400 MG/5ML PO SUSP
30.0000 mL | Freq: Every day | ORAL | Status: DC | PRN
  Administered 2020-10-19: 30 mL via ORAL
  Filled 2020-10-18: qty 30

## 2020-10-18 MED ORDER — LACTATED RINGERS IV SOLN: INTRAVENOUS | Status: DC

## 2020-10-18 MED ORDER — CEFAZOLIN SODIUM-DEXTROSE 2-4 GM/100ML-% IV SOLN
2.0000 g | Freq: Four times a day (QID) | INTRAVENOUS | Status: AC
Start: 2020-10-18 — End: 2020-10-19
  Administered 2020-10-18 – 2020-10-19 (×2): 2 g via INTRAVENOUS
  Filled 2020-10-18 (×2): qty 100

## 2020-10-18 MED ORDER — BISACODYL 10 MG RE SUPP: 10.0000 mg | Freq: Every day | RECTAL | Status: DC | PRN

## 2020-10-18 MED ORDER — BUPIVACAINE-EPINEPHRINE (PF) 0.25% -1:200000 IJ SOLN
INTRAMUSCULAR | Status: DC | PRN
  Administered 2020-10-18: 30 mL via PERINEURAL

## 2020-10-18 MED ORDER — PHENYLEPHRINE HCL (PRESSORS) 10 MG/ML IV SOLN
INTRAVENOUS | Status: DC | PRN
  Administered 2020-10-18 (×4): 80 ug via INTRAVENOUS

## 2020-10-18 MED ORDER — SURGIRINSE WOUND IRRIGATION SYSTEM - OPTIME: TOPICAL | Status: DC | PRN

## 2020-10-18 MED ORDER — METOCLOPRAMIDE HCL 10 MG PO TABS: 5.0000 mg | ORAL_TABLET | Freq: Three times a day (TID) | ORAL | Status: DC | PRN

## 2020-10-18 MED ORDER — CHLORHEXIDINE GLUCONATE 0.12 % MT SOLN
OROMUCOSAL | Status: AC
  Administered 2020-10-18: 15 mL via OROMUCOSAL
  Filled 2020-10-18: qty 15

## 2020-10-18 MED ORDER — LIDOCAINE HCL (CARDIAC) PF 100 MG/5ML IV SOSY
PREFILLED_SYRINGE | INTRAVENOUS | Status: DC | PRN
  Administered 2020-10-18: 40 mg via INTRAVENOUS

## 2020-10-18 MED ORDER — 0.9 % SODIUM CHLORIDE (POUR BTL) OPTIME
TOPICAL | Status: DC | PRN
  Administered 2020-10-18: 500 mL

## 2020-10-18 MED ORDER — ACETAMINOPHEN 325 MG PO TABS
325.0000 mg | ORAL_TABLET | Freq: Four times a day (QID) | ORAL | Status: DC | PRN
  Administered 2020-10-19: 650 mg via ORAL
  Filled 2020-10-18: qty 2

## 2020-10-18 MED ORDER — DOCUSATE SODIUM 100 MG PO CAPS
100.0000 mg | ORAL_CAPSULE | Freq: Two times a day (BID) | ORAL | Status: DC
  Administered 2020-10-18 – 2020-10-20 (×4): 100 mg via ORAL
  Filled 2020-10-18 (×4): qty 1

## 2020-10-18 MED ORDER — ACETAMINOPHEN 10 MG/ML IV SOLN
INTRAVENOUS | Status: DC | PRN
  Administered 2020-10-18: 1000 mg via INTRAVENOUS

## 2020-10-18 MED ORDER — FAMOTIDINE 20 MG PO TABS
ORAL_TABLET | ORAL | Status: AC
  Administered 2020-10-18: 20 mg via ORAL
  Filled 2020-10-18: qty 1

## 2020-10-18 MED ORDER — HYDROCODONE-ACETAMINOPHEN 5-325 MG PO TABS
1.0000 | ORAL_TABLET | ORAL | Status: DC | PRN
  Administered 2020-10-19 – 2020-10-20 (×3): 1 via ORAL
  Filled 2020-10-18 (×3): qty 1

## 2020-10-18 MED ORDER — HYDROCHLOROTHIAZIDE 25 MG PO TABS
25.0000 mg | ORAL_TABLET | Freq: Every day | ORAL | Status: DC
  Administered 2020-10-20: 25 mg via ORAL
  Filled 2020-10-18 (×2): qty 1

## 2020-10-18 MED ORDER — METOCLOPRAMIDE HCL 5 MG/ML IJ SOLN: 5.0000 mg | Freq: Three times a day (TID) | INTRAMUSCULAR | Status: DC | PRN

## 2020-10-18 MED ORDER — TRANEXAMIC ACID 1000 MG/10ML IV SOLN
INTRAVENOUS | Status: AC
  Filled 2020-10-18: qty 10

## 2020-10-18 MED ORDER — CEFAZOLIN SODIUM-DEXTROSE 2-4 GM/100ML-% IV SOLN
INTRAVENOUS | Status: AC
  Filled 2020-10-18: qty 100

## 2020-10-18 MED ORDER — LISINOPRIL 10 MG PO TABS
10.0000 mg | ORAL_TABLET | Freq: Every day | ORAL | Status: DC
  Administered 2020-10-18 – 2020-10-20 (×3): 10 mg via ORAL
  Filled 2020-10-18 (×3): qty 1

## 2020-10-18 MED ORDER — PROPOFOL 500 MG/50ML IV EMUL
INTRAVENOUS | Status: DC | PRN
  Administered 2020-10-18: 50 ug/kg/min via INTRAVENOUS

## 2020-10-18 MED ORDER — ONDANSETRON HCL 4 MG/2ML IJ SOLN: 4.0000 mg | Freq: Four times a day (QID) | INTRAMUSCULAR | Status: DC | PRN

## 2020-10-18 MED ORDER — ONDANSETRON HCL 4 MG PO TABS: 4.0000 mg | ORAL_TABLET | Freq: Four times a day (QID) | ORAL | Status: DC | PRN

## 2020-10-18 MED ORDER — GLIPIZIDE ER 5 MG PO TB24
5.0000 mg | ORAL_TABLET | Freq: Every day | ORAL | Status: DC
  Administered 2020-10-20: 5 mg via ORAL
  Filled 2020-10-18 (×2): qty 1

## 2020-10-18 MED ORDER — ALUM & MAG HYDROXIDE-SIMETH 200-200-20 MG/5ML PO SUSP: 30.0000 mL | ORAL | Status: DC | PRN

## 2020-10-18 MED ORDER — FENTANYL CITRATE (PF) 100 MCG/2ML IJ SOLN: 25.0000 ug | INTRAMUSCULAR | Status: DC | PRN

## 2020-10-18 MED ORDER — ONDANSETRON HCL 4 MG/2ML IJ SOLN: 4.0000 mg | Freq: Once | INTRAMUSCULAR | Status: DC | PRN

## 2020-10-18 MED ORDER — DEXMEDETOMIDINE (PRECEDEX) IN NS 20 MCG/5ML (4 MCG/ML) IV SYRINGE
PREFILLED_SYRINGE | INTRAVENOUS | Status: DC | PRN
  Administered 2020-10-18 (×3): 4 ug via INTRAVENOUS

## 2020-10-18 MED ORDER — HYDROCODONE-ACETAMINOPHEN 7.5-325 MG PO TABS
1.0000 | ORAL_TABLET | ORAL | Status: DC | PRN
  Filled 2020-10-18: qty 2

## 2020-10-18 MED ORDER — ACETAMINOPHEN 10 MG/ML IV SOLN
INTRAVENOUS | Status: AC
  Filled 2020-10-18: qty 100

## 2020-10-18 MED ORDER — BUPIVACAINE HCL (PF) 0.5 % IJ SOLN
INTRAMUSCULAR | Status: AC
  Filled 2020-10-18: qty 10

## 2020-10-18 MED ORDER — ACETAMINOPHEN 10 MG/ML IV SOLN: 1000.0000 mg | Freq: Once | INTRAVENOUS | Status: DC | PRN

## 2020-10-18 MED ORDER — TRANEXAMIC ACID-NACL 1000-0.7 MG/100ML-% IV SOLN
1000.0000 mg | INTRAVENOUS | Status: AC
  Administered 2020-10-18: 1000 mg via INTRAVENOUS

## 2020-10-18 MED ORDER — ENOXAPARIN SODIUM 40 MG/0.4ML IJ SOSY
40.0000 mg | PREFILLED_SYRINGE | INTRAMUSCULAR | Status: DC
  Administered 2020-10-19 – 2020-10-20 (×2): 40 mg via SUBCUTANEOUS
  Filled 2020-10-18 (×2): qty 0.4

## 2020-10-18 MED ORDER — BUPIVACAINE-EPINEPHRINE (PF) 0.25% -1:200000 IJ SOLN
INTRAMUSCULAR | Status: AC
  Filled 2020-10-18: qty 30

## 2020-10-18 MED ORDER — PHENOL 1.4 % MT LIQD
1.0000 | OROMUCOSAL | Status: DC | PRN
  Filled 2020-10-18: qty 177

## 2020-10-18 MED ORDER — SODIUM CHLORIDE 0.9 % IR SOLN
Status: DC | PRN
  Administered 2020-10-18: 250 mL
  Administered 2020-10-18: 3000 mL

## 2020-10-18 MED ORDER — MORPHINE SULFATE (PF) 2 MG/ML IV SOLN
0.5000 mg | INTRAVENOUS | Status: DC | PRN
  Administered 2020-10-18: 1 mg via INTRAVENOUS
  Filled 2020-10-18: qty 1

## 2020-10-18 MED ORDER — GLYCOPYRROLATE 0.2 MG/ML IJ SOLN
INTRAMUSCULAR | Status: DC | PRN
  Administered 2020-10-18: .2 mg via INTRAVENOUS

## 2020-10-18 MED ORDER — BUPIVACAINE HCL (PF) 0.5 % IJ SOLN
INTRAMUSCULAR | Status: DC | PRN
  Administered 2020-10-18: 3 mL

## 2020-10-18 MED ORDER — MENTHOL 3 MG MT LOZG
1.0000 | LOZENGE | OROMUCOSAL | Status: DC | PRN
  Filled 2020-10-18: qty 9

## 2020-10-18 SURGICAL SUPPLY — 51 items
BLADE SAGITTAL WIDE XTHICK NO (BLADE) ×2 IMPLANT
BRUSH SCRUB EZ  4% CHG (MISCELLANEOUS) ×4
BRUSH SCRUB EZ 4% CHG (MISCELLANEOUS) ×2 IMPLANT
CHLORAPREP W/TINT 26 (MISCELLANEOUS) ×2 IMPLANT
COVER HOLE (Hips) ×2 IMPLANT
DRAPE 3/4 80X56 (DRAPES) ×2 IMPLANT
DRAPE C-ARM 42X72 X-RAY (DRAPES) ×2 IMPLANT
DRAPE STERI IOBAN 125X83 (DRAPES) IMPLANT
DRSG AQUACEL AG ADV 3.5X10 (GAUZE/BANDAGES/DRESSINGS) IMPLANT
DRSG AQUACEL AG ADV 3.5X14 (GAUZE/BANDAGES/DRESSINGS) IMPLANT
ELECT REM PT RETURN 9FT ADLT (ELECTROSURGICAL) ×2
ELECTRODE REM PT RTRN 9FT ADLT (ELECTROSURGICAL) ×1 IMPLANT
GAUZE 4X4 16PLY ~~LOC~~+RFID DBL (SPONGE) ×2 IMPLANT
GAUZE XEROFORM 1X8 LF (GAUZE/BANDAGES/DRESSINGS) IMPLANT
GLOVE SURG ORTHO LTX SZ8 (GLOVE) ×4 IMPLANT
GLOVE SURG UNDER LTX SZ8 (GLOVE) ×2 IMPLANT
GOWN STRL REUS W/ TWL LRG LVL3 (GOWN DISPOSABLE) ×1 IMPLANT
GOWN STRL REUS W/ TWL XL LVL3 (GOWN DISPOSABLE) ×1 IMPLANT
GOWN STRL REUS W/TWL LRG LVL3 (GOWN DISPOSABLE) ×2
GOWN STRL REUS W/TWL XL LVL3 (GOWN DISPOSABLE) ×2
HEAD FEMORAL SZ21 -3 OXINIUM (Head) ×2 IMPLANT
IRRIGATION SURGIPHOR STRL (IV SOLUTION) IMPLANT
IV NS 1000ML (IV SOLUTION) ×2
IV NS 1000ML BAXH (IV SOLUTION) ×1 IMPLANT
KIT PATIENT CARE HANA TABLE (KITS) ×2 IMPLANT
KIT TURNOVER CYSTO (KITS) ×2 IMPLANT
LINER 3H HEMI SHELL 48MM (Liner) ×2 IMPLANT
LINER ACETABULAR 32X48 (Liner) ×2 IMPLANT
MANIFOLD NEPTUNE II (INSTRUMENTS) ×2 IMPLANT
MAT ABSORB  FLUID 56X50 GRAY (MISCELLANEOUS) ×2
MAT ABSORB FLUID 56X50 GRAY (MISCELLANEOUS) ×1 IMPLANT
NDL SAFETY ECLIPSE 18X1.5 (NEEDLE) IMPLANT
NEEDLE HYPO 18GX1.5 SHARP (NEEDLE)
NEEDLE HYPO 22GX1.5 SAFETY (NEEDLE) ×2 IMPLANT
NEEDLE SPNL 20GX3.5 QUINCKE YW (NEEDLE) ×2 IMPLANT
PACK HIP PROSTHESIS (MISCELLANEOUS) ×2 IMPLANT
PADDING CAST BLEND 4X4 NS (MISCELLANEOUS) ×4 IMPLANT
PILLOW ABDUCTION MEDIUM (MISCELLANEOUS) ×2 IMPLANT
PULSAVAC PLUS IRRIG FAN TIP (DISPOSABLE) ×2
SCREW 6.5X25MM (Screw) ×2 IMPLANT
SPONGE T-LAP 18X18 ~~LOC~~+RFID (SPONGE) ×8 IMPLANT
STAPLER SKIN PROX 35W (STAPLE) ×2 IMPLANT
STEM LATERAL COLLAR LAT1 12/14 (Stem) ×2 IMPLANT
SUT BONE WAX W31G (SUTURE) ×2 IMPLANT
SUT DVC 2 QUILL PDO  T11 36X36 (SUTURE) ×2
SUT DVC 2 QUILL PDO T11 36X36 (SUTURE) ×1 IMPLANT
SUT VIC AB 2-0 CT1 18 (SUTURE) ×2 IMPLANT
SYR 20ML LL LF (SYRINGE) ×2 IMPLANT
TIP FAN IRRIG PULSAVAC PLUS (DISPOSABLE) ×1 IMPLANT
WAND WEREWOLF FASTSEAL 6.0 (MISCELLANEOUS) ×2 IMPLANT
WATER STERILE IRR 500ML POUR (IV SOLUTION) ×2 IMPLANT

## 2020-10-18 NOTE — Progress Notes (Signed)
Discussed pt history with Dr. Erenest Rasher regarding arm restriction r/t bilateral mastectomy. Per patient report, lymph nodes removed from the left, but not the right. Using this information, it was decided that blood pressure, labs and iv placement should be placed on the right arm.

## 2020-10-18 NOTE — Anesthesia Preprocedure Evaluation (Addendum)
Anesthesia Evaluation  Patient identified by MRN, date of birth, ID band Patient awake    Reviewed: Allergy & Precautions, NPO status , Patient's Chart, lab work & pertinent test results  History of Anesthesia Complications Negative for: history of anesthetic complications  Airway Mallampati: IV   Neck ROM: Full    Dental  (+) Upper Dentures, Partial Lower   Pulmonary neg pulmonary ROS,    Pulmonary exam normal breath sounds clear to auscultation       Cardiovascular hypertension, + Peripheral Vascular Disease  Normal cardiovascular exam+ Valvular Problems/Murmurs MVP  Rhythm:Regular Rate:Normal  ECG 10/08/20: normal sinus rhythm, inferior and anterolateral T wave changes.  TTE 10/11/20: normal left ventricular systolic function with mild LVH; LVEF 55-60%.  GLS was -17%.  There was mild mitral valve regurgitation, however no evidence of prolapse.  There was no evidence of valvular stenosis.  Study determined to be normal low risk.   Neuro/Psych CVA (2011), No Residual Symptoms    GI/Hepatic negative GI ROS,   Endo/Other  diabetes, Type 2  Renal/GU negative Renal ROS     Musculoskeletal   Abdominal   Peds  Hematology  (+) Blood dyscrasia, anemia , Breast CA   Anesthesia Other Findings   Reproductive/Obstetrics                           Anesthesia Physical Anesthesia Plan  ASA: 3  Anesthesia Plan: General and Spinal   Post-op Pain Management:    Induction: Intravenous  PONV Risk Score and Plan: 3 and Propofol infusion, TIVA, Treatment may vary due to age or medical condition and Ondansetron  Airway Management Planned: Natural Airway  Additional Equipment:   Intra-op Plan:   Post-operative Plan:   Informed Consent:   Plan Discussed with:   Anesthesia Plan Comments: (Plan for spinal and GA with natural airway, GETA backup.)        Anesthesia Quick Evaluation

## 2020-10-18 NOTE — Transfer of Care (Signed)
Immediate Anesthesia Transfer of Care Note  Patient: Rebekah Lowery  Procedure(s) Performed: TOTAL HIP ARTHROPLASTY ANTERIOR APPROACH (Left: Hip)  Patient Location: PACU  Anesthesia Type:Spinal  Level of Consciousness: drowsy and patient cooperative  Airway & Oxygen Therapy: Patient Spontanous Breathing  Post-op Assessment: Report given to RN and Post -op Vital signs reviewed and stable  Post vital signs: Reviewed and stable  Last Vitals:  Vitals Value Taken Time  BP 126/59 10/18/20 1742  Temp    Pulse 56 10/18/20 1746  Resp 15 10/18/20 1746  SpO2 100 % 10/18/20 1746  Vitals shown include unvalidated device data.  Last Pain:  Vitals:   10/18/20 1328  TempSrc: Oral  PainSc: 0-No pain         Complications: No notable events documented.

## 2020-10-18 NOTE — Anesthesia Procedure Notes (Signed)
Spinal  Patient location during procedure: OR Start time: 10/18/2020 3:45 PM End time: 10/18/2020 3:49 PM Reason for block: surgical anesthesia Staffing Performed: resident/CRNA  Anesthesiologist: Iran Ouch, MD Resident/CRNA: Jerrye Noble, CRNA Preanesthetic Checklist Completed: patient identified, IV checked, site marked, risks and benefits discussed, surgical consent, monitors and equipment checked, pre-op evaluation and timeout performed Spinal Block Patient position: sitting Prep: DuraPrep Patient monitoring: heart rate, cardiac monitor, continuous pulse ox and blood pressure Approach: midline Location: L3-4 Injection technique: single-shot Needle Needle type: Pencan  Needle gauge: 24 G Needle length: 9 cm Assessment Sensory level: T4 Events: CSF return Additional Notes 1 attempt Negative heme, negative paresthesia.

## 2020-10-18 NOTE — H&P (Signed)
The patient has been re-examined, and the chart reviewed, and there have been no interval changes to the documented history and physical.  Plan a left total hip today.  Anesthesia is not consulted regarding a peripheral nerve block for post-operative pain.  The risks, benefits, and alternatives have been discussed at length, and the patient is willing to proceed.    

## 2020-10-18 NOTE — Op Note (Signed)
10/18/2020  5:40 PM  PATIENT:  Rebekah Lowery   MRN: [id number]  PRE-OPERATIVE DIAGNOSIS:  Osteoarthritis left hip   POST-OPERATIVE DIAGNOSIS: Same  Procedure: Left Total Hip Replacement  Surgeon: Elyn Aquas. Harlow Mares, MD   Assist: Carlynn Spry, PA-C  Anesthesia: Spinal   EBL: 100 mL   Specimens: None   Drains: None   Components used: A size 1 lateral Polarstem [employer], R3 size 48 mm shell, and a 32 mm -3 mm head    Description of the procedure in detail: After informed consent was obtained and the appropriate extremity marked in the pre-operative holding area, the patient was taken to the operating room and placed in the supine position on the fracture table. All pressure points were well padded and bilateral lower extremities were place in traction spars. The hip was prepped and draped in standard sterile fashion. A spinal anesthetic had been delivered by the anesthesia team. The skin and subcutaneous tissues were injected with a mixture of Marcaine with epinephrine for post-operative pain. A longitudinal incision approximately 10 cm in length was carried out from the anterior superior iliac spine to the greater trochanter. The tensor fascia was divided and blunt dissection was taken down to the level of the joint capsule. The lateral circumflex vessels were cauterized. Deep retractors were placed and a portion of the anterior capsule was excised. Using fluoroscopy the neck cut was planned and carried out with a sagittal saw. The head was passed from the field with use of a corkscrew and hip skid. Deep retractors were placed along the acetabulum and the degenerative labrum and large osteophytes were removed with a Rongeur. The cup was sequentially reamed to a size 48 mm. The wound was irrigated and using fluoroscopy the size 48 mm cup was impacted in to anatomic position. A single screw was placed followed by a threaded hole cover. The final liner was impacted in to position.  Attention was then turned to the proximal femur. The leg was placed in extension and external rotation. The canal was opened and sequentially broached to a size 1 lateral. The trial components were placed and the hip relocated. The components were found to be in good position using fluoroscopy. The hip was dislocated and the trial components removed. The final components were impacted in to position and the hip relocated. The final components were again check with fluoroscopy and found to be in good position. Hemostasis was achieved with electrocautery. The deep capsule was injected with Marcaine and epinephrine. The wound was irrigated with bacitracin laced normal saline and the tensor fascia closed with #2 Quill suture. The subcutaneous tissues were closed with 2-0 vicryl and staples for the skin. A sterile dressing was applied and an abduction pillow. Patient tolerated the procedure well and there were no apparent complication. Patient was taken to the recovery room in good condition.   Kurtis Bushman, MD

## 2020-10-19 ENCOUNTER — Encounter: Payer: Self-pay | Admitting: Orthopedic Surgery

## 2020-10-19 LAB — CBC
HCT: 31.8 % — ABNORMAL LOW (ref 36.0–46.0)
Hemoglobin: 10.6 g/dL — ABNORMAL LOW (ref 12.0–15.0)
MCH: 27.2 pg (ref 26.0–34.0)
MCHC: 33.3 g/dL (ref 30.0–36.0)
MCV: 81.7 fL (ref 80.0–100.0)
Platelets: 225 10*3/uL (ref 150–400)
RBC: 3.89 MIL/uL (ref 3.87–5.11)
RDW: 14.9 % (ref 11.5–15.5)
WBC: 9 10*3/uL (ref 4.0–10.5)
nRBC: 0 % (ref 0.0–0.2)

## 2020-10-19 LAB — BASIC METABOLIC PANEL
Anion gap: 6 (ref 5–15)
BUN: 17 mg/dL (ref 8–23)
CO2: 25 mmol/L (ref 22–32)
Calcium: 8.2 mg/dL — ABNORMAL LOW (ref 8.9–10.3)
Chloride: 104 mmol/L (ref 98–111)
Creatinine, Ser: 0.89 mg/dL (ref 0.44–1.00)
GFR, Estimated: >60 mL/min (ref >60)
Glucose, Bld: 233 mg/dL — ABNORMAL HIGH (ref 70–99)
Potassium: 4.1 mmol/L (ref 3.5–5.1)
Sodium: 135 mmol/L (ref 135–145)

## 2020-10-19 NOTE — Anesthesia Postprocedure Evaluation (Signed)
Anesthesia Post Note  Patient: Rebekah Lowery  Procedure(s) Performed: TOTAL HIP ARTHROPLASTY ANTERIOR APPROACH (Left: Hip)  Patient location during evaluation: Nursing Unit Anesthesia Type: Spinal Level of consciousness: oriented and awake and alert Pain management: pain level controlled Vital Signs Assessment: post-procedure vital signs reviewed and stable Respiratory status: spontaneous breathing and respiratory function stable Cardiovascular status: blood pressure returned to baseline and stable Postop Assessment: no headache, no backache, no apparent nausea or vomiting and patient able to bend at knees Anesthetic complications: no   No notable events documented.   Last Vitals:  Vitals:   10/18/20 2334 10/19/20 0343  BP: (!) 154/71 (!) 159/62  Pulse: 70 68  Resp: 16 20  Temp: 36.7 C 36.8 C  SpO2: 100% 100%    Last Pain:  Vitals:   10/19/20 0400  TempSrc:   PainSc: 0-No pain                 Rolla Plate P

## 2020-10-19 NOTE — Progress Notes (Signed)
Patient arrived to room 140 in stable condition. Aox4. Reports 3/10 left hip pain. Ice pack on. Dressing C/D/I. Plan of care reviewed with patient and daughter. Room/unit orientation completed. Call bell within reach. Bed alarm on.

## 2020-10-19 NOTE — Progress Notes (Addendum)
Met with the patient and her daughter in the room to discuss DC plan and needs She has a rolling walker that was her husbands that he does not use that she will use at home, her daughter has a Bedside Commode she will use she has transportation  She can afford her medication I reached out to [name] with [name] to inquire if they will accept the patient for [facility] Awaiting a call back, no additional needs, [name] is not able to accept the patient, [name] has accepted the patient for [facility]

## 2020-10-19 NOTE — Progress Notes (Signed)
Physical Therapy Treatment Patient Details Name: Rebekah Lowery MRN: [id number] DOB: [birth year] Today's Date: 10/19/2020   History of Present Illness Pt is an 87 yo female s/p L THA, anterior approach. PMH of HTN, CVA, masectomy bilaterally.    PT Comments    Patient alert, agreeable to PT, reported L hip pain as a 3/10 with mobility, that decreased with walking. Sit <> Stand with RW and CGA, initial cues for hand placement. She was able to progress ambulation to 265ft with RW and CGA/supervision, cued for gait velocity and weight bearing. Returned to supine, all needs in reach with family at bedside. The patient would benefit from further skilled PT intervention to continue to progress towards goals. Recommendation remains appropriate.     Recommendations for follow up therapy are one component of a multi-disciplinary discharge planning process, led by the attending physician.  Recommendations may be updated based on patient status, additional functional criteria and insurance authorization.  Follow Up Recommendations  Home health PT;Supervision - Intermittent     Equipment Recommendations  Rolling walker with 5" wheels    Recommendations for Other Services       Precautions / Restrictions Precautions Precautions: Fall Restrictions Weight Bearing Restrictions: Yes LLE Weight Bearing: Weight bearing as tolerated     Mobility  Bed Mobility Overal bed mobility: Needs Assistance Bed Mobility: Sit to Supine     Supine to sit: Min assist Sit to supine: Min assist   General bed mobility comments: for LLE assist    Transfers Overall transfer level: Needs assistance Equipment used: Rolling walker (2 wheeled) Transfers: Sit to/from Stand Sit to Stand: Min guard         General transfer comment: initial cue for hand placement  Ambulation/Gait Ambulation/Gait assistance: Min guard;Supervision Gait Distance (Feet): 220 Feet Assistive device: Rolling walker (2  wheeled)   Gait velocity: decreased   General Gait Details: step through predominantly, cued to decrease UE Support, improved gait velocity this session   Stairs             Wheelchair Mobility    Modified Rankin (Stroke Patients Only)       Balance Overall balance assessment: Needs assistance Sitting-balance support: Feet supported Sitting balance-Leahy Scale: Good       Standing balance-Leahy Scale: Fair Standing balance comment: one LOB during standing without UE support, CGA to correct                            Cognition Arousal/Alertness: Awake/alert Behavior During Therapy: WFL for tasks assessed/performed Overall Cognitive Status: Within Functional Limits for tasks assessed                                        Exercises Total Joint Exercises Ankle Circles/Pumps: AROM;Both;10 reps Short Arc Quad: AROM;Strengthening;Left;10 reps Heel Slides: AAROM;AROM;Strengthening;Left;10 reps Hip ABduction/ADduction: AROM;AAROM;Strengthening;Left;10 reps Long Arc Quad: AROM;Both;10 reps Other Exercises Other Exercises: pt able to use BSC over standard commode with supervision    General Comments        Pertinent Vitals/Pain Pain Assessment: 0-10 Pain Score: 3  Pain Location: L hip during ambulation Pain Descriptors / Indicators: Sore Pain Intervention(s): Limited activity within patient's tolerance;Monitored during session;Repositioned    Home Living Family/patient expects to be discharged to:: Private residence Living Arrangements: Spouse/significant other;Children Available Help at Discharge: Family;Friend(s);Personal care attendant Type of Home: House  Home Access: Ramped entrance   Home Layout: Two level Home Equipment: Cane - single point      Prior Function Level of Independence: Independent          PT Goals (current goals can now be found in the care plan section) Acute Rehab PT Goals Patient Stated Goal: to go  home PT Goal Formulation: With patient Time For Goal Achievement: 11/02/20 Potential to Achieve Goals: Good Progress towards PT goals: Progressing toward goals    Frequency    BID      PT Plan Current plan remains appropriate    Co-evaluation              AM-PAC PT "6 Clicks" Mobility   Outcome Measure  Help needed turning from your back to your side while in a flat bed without using bedrails?: A Little Help needed moving from lying on your back to sitting on the side of a flat bed without using bedrails?: A Little Help needed moving to and from a bed to a chair (including a wheelchair)?: A Little Help needed standing up from a chair using your arms (e.g., wheelchair or bedside chair)?: A Little Help needed to walk in hospital room?: A Little Help needed climbing 3-5 steps with a railing? : A Little 6 Click Score: 18    End of Session Equipment Utilized During Treatment: Gait belt Activity Tolerance: Patient tolerated treatment well Patient left: with call bell/phone within reach;in bed;with bed alarm set Nurse Communication: Mobility status PT Visit Diagnosis: Other abnormalities of gait and mobility (R26.89);Pain;Difficulty in walking, not elsewhere classified (R26.2) Pain - Right/Left: Left Pain - part of body: Hip     Time: 1345-1420 PT Time Calculation (min) (ACUTE ONLY): 35 min  Charges:  $Therapeutic Exercise: 8-22 mins $Therapeutic Activity: 8-22 mins                    Lieutenant Diego PT, DPT 3:00 PM,10/19/20

## 2020-10-19 NOTE — Evaluation (Signed)
Physical Therapy Evaluation Patient Details Name: Rebekah Lowery MRN: [id number] DOB: [birth year] Today's Date: 10/19/2020  History of Present Illness  Pt is an 87 yo female s/p L THA, anterior approach. PMH of HTN, CVA, masectomy bilaterally.  Clinical Impression  Pt easily woken at start of session, oriented x4. Reported pain at rest minimal, 5-6 during session during mobility. 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 Has family, friends, and personal aides set up at discharge to assist as needed.  She was able to perform several supine exercises with verbal and tactile cues, AAROM initially but progressed to AROM of LLE. Supine to sit with minA to assist with LLE, and able to sit EOB with good seated balance. Sit <> stand many times during session from EOB, BSC, and recliner, CGA and RW. She ambulated ~150ft with RW and CGA, chair follow for safety, step through gait pattern at decreased gait velocity.  Overall the patient demonstrated deficits (see "PT Problem List") that impede the patient's functional abilities, safety, and mobility and would benefit from skilled PT intervention. Recommendation is HHPT with intermittent supervision.     Recommendations for follow up therapy are one component of a multi-disciplinary discharge planning process, led by the attending physician.  Recommendations may be updated based on patient status, additional functional criteria and insurance authorization.  Follow Up Recommendations Home health PT;Supervision - Intermittent    Equipment Recommendations  Rolling walker with 5" wheels    Recommendations for Other Services       Precautions / Restrictions Precautions Precautions: Fall Restrictions Weight Bearing Restrictions: Yes LLE Weight Bearing: Weight bearing as tolerated      Mobility  Bed Mobility Overal bed mobility: Needs Assistance Bed Mobility: Supine to Sit     Supine to sit: Min assist      General bed mobility comments: for LLE assist    Transfers Overall transfer level: Needs assistance Equipment used: Rolling walker (2 wheeled) Transfers: Sit to/from Stand Sit to Stand: Min guard         General transfer comment: pt with excellent hand placement, safety  Ambulation/Gait Ambulation/Gait assistance: Min guard Gait Distance (Feet): 180 Feet Assistive device: Rolling walker (2 wheeled)   Gait velocity: decreased   General Gait Details: pt able to progress to step through gait pattern with little cueing, cued for upright posture and rest breaks as needed  Stairs            Wheelchair Mobility    Modified Rankin (Stroke Patients Only)       Balance Overall balance assessment: Needs assistance Sitting-balance support: Feet supported Sitting balance-Leahy Scale: Good       Standing balance-Leahy Scale: Fair Standing balance comment: reliant on UE during dynamic tasks                             Pertinent Vitals/Pain Pain Assessment: 0-10 Pain Score: 6  Pain Location: L hip during ambulation Pain Descriptors / Indicators: Sore Pain Intervention(s): Limited activity within patient's tolerance;Monitored during session;Repositioned;Patient requesting pain meds-RN notified    Home Living Family/patient expects to be discharged to:: Private residence Living Arrangements: Spouse/significant other;Children Available Help at Discharge: Family;Friend(s);Personal care attendant Type of Home: House Home Access: Ramped entrance     Home Layout: Two level Home Equipment: [place] - single point      Prior Function Level of Independence: Independent  Hand Dominance   Dominant Hand: Right    Extremity/Trunk Assessment   Upper Extremity Assessment Upper Extremity Assessment: Overall WFL for tasks assessed    Lower Extremity Assessment Lower Extremity Assessment: RLE deficits/detail;LLE deficits/detail RLE Deficits  / Details: WFLs LLE Deficits / Details: s/p L THA       Communication   Communication: No difficulties  Cognition Arousal/Alertness: Awake/alert Behavior During Therapy: WFL for tasks assessed/performed Overall Cognitive Status: Within Functional Limits for tasks assessed                                        General Comments      Exercises Total Joint Exercises Ankle Circles/Pumps: AROM;Both;5 reps Short Arc Quad: AROM;Strengthening;Left;10 reps Heel Slides: AAROM;AROM;Strengthening;Left;10 reps Hip ABduction/ADduction: AROM;AAROM;Strengthening;Left;10 reps Other Exercises Other Exercises: pt assisted to [facility], voided and performed pericare with CGA in standing   Assessment/Plan    PT Assessment Patient needs continued PT services  PT Problem List Decreased strength;Decreased mobility;Decreased range of motion;Decreased activity tolerance;Decreased balance;Pain;Decreased knowledge of use of DME       PT Treatment Interventions DME instruction;Therapeutic exercise;Gait training;Balance training;Stair training;Neuromuscular re-education;Functional mobility training;Therapeutic activities;Patient/family education    PT Goals (Current goals can be found in the Care Plan section)  Acute Rehab PT Goals Patient Stated Goal: to go home PT Goal Formulation: With patient Time For Goal Achievement: 11/02/20 Potential to Achieve Goals: Good    Frequency BID   Barriers to discharge        Co-evaluation               AM-PAC PT "6 Clicks" Mobility  Outcome Measure Help needed turning from your back to your side while in a flat bed without using bedrails?: A Little Help needed moving from lying on your back to sitting on the side of a flat bed without using bedrails?: A Little Help needed moving to and from a bed to a chair (including a wheelchair)?: A Little Help needed standing up from a chair using your arms (e.g., wheelchair or bedside chair)?: A  Little Help needed to walk in hospital room?: A Little Help needed climbing 3-5 steps with a railing? : A Little 6 Click Score: 18    End of Session Equipment Utilized During Treatment: Gait belt Activity Tolerance: Patient tolerated treatment well Patient left: in chair;with chair alarm set;with call bell/phone within reach Nurse Communication: Mobility status PT Visit Diagnosis: Other abnormalities of gait and mobility (R26.89);Pain;Difficulty in walking, not elsewhere classified (R26.2) Pain - Right/Left: Left Pain - part of body: Hip    Time: 6659-9357 PT Time Calculation (min) (ACUTE ONLY): 43 min   Charges:   PT Evaluation $PT Eval Low Complexity: 1 Low PT Treatments $Therapeutic Exercise: 23-37 mins $Therapeutic Activity: 8-22 mins       Lieutenant Diego PT, DPT 11:16 AM,10/19/20

## 2020-10-20 LAB — CBC
HCT: 29.5 % — ABNORMAL LOW (ref 36.0–46.0)
Hemoglobin: 10 g/dL — ABNORMAL LOW (ref 12.0–15.0)
MCH: 27.5 pg (ref 26.0–34.0)
MCHC: 33.9 g/dL (ref 30.0–36.0)
MCV: 81.3 fL (ref 80.0–100.0)
Platelets: 216 10*3/uL (ref 150–400)
RBC: 3.63 MIL/uL — ABNORMAL LOW (ref 3.87–5.11)
RDW: 14.9 % (ref 11.5–15.5)
WBC: 8.7 10*3/uL (ref 4.0–10.5)
nRBC: 0 % (ref 0.0–0.2)

## 2020-10-20 LAB — SURGICAL PATHOLOGY

## 2020-10-20 MED ORDER — DOCUSATE SODIUM 100 MG PO CAPS: 100.0000 mg | ORAL_CAPSULE | Freq: Two times a day (BID) | ORAL | 0 refills | Status: AC

## 2020-10-20 MED ORDER — RIVAROXABAN 10 MG PO TABS: 10.0000 mg | ORAL_TABLET | Freq: Every day | ORAL | 0 refills | Status: AC

## 2020-10-20 MED ORDER — HYDROCODONE-ACETAMINOPHEN 5-325 MG PO TABS: 1.0000 | ORAL_TABLET | ORAL | 0 refills | Status: DC | PRN

## 2020-10-20 NOTE — Discharge Summary (Signed)
Physician Discharge Summary  Patient ID: Rebekah Lowery MRN: [id number] DOB/AGE: [birth year] 85 y.o.  Admit date: 10/18/2020 Discharge date: 10/20/2020  Admission Diagnoses:  M16.12 Unilateral primary osteoarthritis, left hip <principal problem not specified>  Discharge Diagnoses:  M16.12 Unilateral primary osteoarthritis, left hip Active Problems:   History of total hip replacement, left   Past Medical History:  Diagnosis Date   Anemia    Cancer ([place])    left breast   Change in voice    Diabetes mellitus    Family history of lung cancer    Family history of pancreatic cancer    Hypertension    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   MVP (mitral valve prolapse)    PAD (peripheral artery disease) ([place])    Stroke ([place]) 04/15/09    Surgeries: Procedure(s): TOTAL HIP ARTHROPLASTY ANTERIOR APPROACH on 10/18/2020   Consultants (if any):   Discharged Condition: Improved  Hospital Course: Rebekah Lowery is an 87 y.o. female who was admitted 10/18/2020 with a diagnosis of  M16.12 Unilateral primary osteoarthritis, left hip <principal problem not specified> and went to the operating room on 10/18/2020 and underwent the above named procedures.    She was given perioperative antibiotics:  Anti-infectives (From admission, onward)    Start     Dose/Rate Route Frequency Ordered Stop   10/18/20 2200  ceFAZolin (ANCEF) IVPB 2g/100 mL premix        2 g 200 mL/hr over 30 Minutes Intravenous Every 6 hours 10/18/20 1958 10/19/20 0428   10/18/20 1355  ceFAZolin (ANCEF) 2-4 GM/100ML-% IVPB       Note to Pharmacy: Doreen Salvage   : cabinet override      10/18/20 1355 10/18/20 1600   10/18/20 0600  ceFAZolin (ANCEF) IVPB 2g/100 mL premix        2 g 200 mL/hr over 30 Minutes Intravenous On call to O.R. 10/17/20 2250 10/18/20 1622     .  She was given sequential compression  devices, early ambulation, and xarelto for DVT prophylaxis.  She benefited maximally from the hospital stay and there were no complications.    Recent vital signs:  Vitals:   10/19/20 2037 10/20/20 0751  BP: (!) 161/67 139/65  Pulse: 71 72  Resp: 18 16  Temp: 98.9 F (37.2 C) 98.7 F (37.1 C)  SpO2: 100% 97%    Recent laboratory studies:  Lab Results  Component Value Date   HGB 10.0 (L) 10/20/2020   HGB 10.6 (L) 10/19/2020   HGB 12.0 10/18/2020   Lab Results  Component Value Date   WBC 8.7 10/20/2020   PLT 216 10/20/2020   Lab Results  Component Value Date   INR 1.0 10/07/2020   Lab Results  Component Value Date   NA 135 10/19/2020   K 4.1 10/19/2020   CL 104 10/19/2020   CO2 25 10/19/2020   BUN 17 10/19/2020   CREATININE 0.89 10/19/2020   GLUCOSE 233 (H) 10/19/2020    Discharge Medications:   Allergies as of 10/20/2020       Reactions   Ibuprofen Other (See Comments)   Gi- upset   Other    Cheese causes hypertension and constipation   Aspirin Swelling, Other (See Comments)   Swelling of lips, caused pain also.   Citric Acid Swelling, Rash   Swelling all over the body.   Codeine Itching, Swelling, Rash  Swelling was all over the body.   Lemon Juice Swelling, Rash   Allergic to lemons in general.   Lemon Oil Rash, Swelling   Allergic to lemons in general.   Monosodium Glutamate Other (See Comments)   Causes spike in bp, nausea, constipation, gi upset.   Peanut-containing Drug Products Nausea Only   Gi- upset        Medication List     TAKE these medications    acetaminophen 500 MG tablet Commonly known as: TYLENOL Take 500 mg by mouth every 6 (six) hours as needed for moderate pain or mild pain.   cyanocobalamin 1000 MCG tablet Take 1,000 mcg by mouth daily.   docusate sodium 100 MG capsule Commonly known as: COLACE Take 1 capsule (100 mg total) by mouth 2 (two) times daily.   glipiZIDE 5 MG 24 hr tablet Commonly known as:  GLUCOTROL XL Take 5 mg by mouth daily with breakfast.   hydrochlorothiazide 25 MG tablet Commonly known as: HYDRODIURIL Take 25 mg by mouth daily.   HYDROcodone-acetaminophen 5-325 MG tablet Commonly known as: NORCO/VICODIN Take 1 tablet by mouth every 4 (four) hours as needed for moderate pain (pain score 4-6).   lisinopril 10 MG tablet Commonly known as: ZESTRIL Take 10 mg by mouth daily.   rivaroxaban 10 MG Tabs tablet Commonly known as: XARELTO Take 1 tablet (10 mg total) by mouth daily.               Durable Medical Equipment  (From admission, onward)           Start     Ordered   10/20/20 0704  For home use only DME Walker rolling  Once       Question Answer Comment  Walker: With 5 Inch Wheels   Patient needs a walker to treat with the following condition Osteoarthritis of left hip      10/20/20 0704   10/20/20 0704  For home use only DME 3 n 1  Once        10/20/20 0704   10/18/20 1959  DME Walker rolling  Once       Question:  Patient needs a walker to treat with the following condition  Answer:  History of total hip replacement, left   10/18/20 1958   10/18/20 1959  DME 3 n 1  Once        10/18/20 1958   10/18/20 1959  DME Bedside commode  Once       Question:  Patient needs a bedside commode to treat with the following condition  Answer:  History of total hip replacement, left   10/18/20 1958            Diagnostic Studies: ECHOCARDIOGRAM COMPLETE  Result Date: 10/11/2020    ECHOCARDIOGRAM REPORT   Patient Name:   Rebekah Lowery Date of Exam: 10/11/2020 Medical Rec #:  [id number]        Height:       68.0 in Accession #:    [id number]       Weight:       170.0 lb Date of Birth:  [birth year]         BSA:          1.907 m Patient Age:    43 years         BP:           128/70 mmHg Patient Gender: F  HR:           61 bpm. Exam Location:  [facility] Procedure: 2D Echo, Cardiac Doppler, Color Doppler and Strain Analysis Indications:     Z86.79  H/O Mitral valve prolapse  History:         Patient has no prior history of Echocardiogram examinations.                  Stroke, Mitral Valve Prolapse; Risk Factors:Hypertension and                  Diabetes. PAD.  Sonographer:     Sherrie Sport Referring Phys:  [id number] Kate Sable Diagnosing Phys: Kathlyn Sacramento MD  Sonographer Comments: Global longitudinal strain was attempted. IMPRESSIONS  1. Left ventricular ejection fraction, by estimation, is 55 to 60%. The left ventricle has normal function. The left ventricle has no regional wall motion abnormalities. There is mild left ventricular hypertrophy. Left ventricular diastolic parameters are indeterminate. The average left ventricular global longitudinal strain is -17.0 %. The global longitudinal strain is normal.  2. Right ventricular systolic function is normal. The right ventricular size is normal. There is normal pulmonary artery systolic pressure.  3. The mitral valve is normal in structure. Mild mitral valve regurgitation. No evidence of mitral stenosis.  4. The aortic valve is normal in structure. Aortic valve regurgitation is not visualized. Mild aortic valve sclerosis is present, with no evidence of aortic valve stenosis. FINDINGS  Left Ventricle: Left ventricular ejection fraction, by estimation, is 55 to 60%. The left ventricle has normal function. The left ventricle has no regional wall motion abnormalities. The average left ventricular global longitudinal strain is -17.0 %. The global longitudinal strain is normal. The left ventricular internal cavity size was normal in size. There is mild left ventricular hypertrophy. Left ventricular diastolic parameters are indeterminate. Right Ventricle: The right ventricular size is normal. No increase in right ventricular wall thickness. Right ventricular systolic function is normal. There is normal pulmonary artery systolic pressure. The tricuspid regurgitant velocity is 2.65 m/s, and  with an assumed right  atrial pressure of 5 mmHg, the estimated right ventricular systolic pressure is 99.7 mmHg. Left Atrium: Left atrial size was normal in size. Right Atrium: Right atrial size was normal in size. Pericardium: There is no evidence of pericardial effusion. Mitral Valve: The mitral valve is normal in structure. There is mild thickening of the mitral valve leaflet(s). Mild mitral valve regurgitation. No evidence of mitral valve stenosis. MV peak gradient, 6.6 mmHg. The mean mitral valve gradient is 2.0 mmHg. Tricuspid Valve: The tricuspid valve is normal in structure. Tricuspid valve regurgitation is trivial. No evidence of tricuspid stenosis. Aortic Valve: The aortic valve is normal in structure. Aortic valve regurgitation is not visualized. Mild aortic valve sclerosis is present, with no evidence of aortic valve stenosis. Aortic valve mean gradient measures 4.0 mmHg. Aortic valve peak gradient measures 7.1 mmHg. Aortic valve area, by VTI measures 2.05 cm. Pulmonic Valve: The pulmonic valve was normal in structure. Pulmonic valve regurgitation is not visualized. No evidence of pulmonic stenosis. Aorta: The aortic root is normal in size and structure. Venous: The inferior vena cava was not well visualized. IAS/Shunts: No atrial level shunt detected by color flow Doppler.  LEFT VENTRICLE PLAX 2D LVIDd:         3.86 cm   Diastology LVIDs:         2.32 cm   LV e' medial:    5.33 cm/s LV PW:  0.96 cm   LV E/e' medial:  16.2 LV IVS:        0.97 cm   LV e' lateral:   6.74 cm/s LVOT diam:     2.00 cm   LV E/e' lateral: 12.8 LV SV:         65 LV SV Index:   34        2D Longitudinal Strain LVOT Area:     3.14 cm  2D Strain GLS Avg:     -17.0 %                           3D Volume EF:                          3D EF:        54 %                          LV EDV:       136 ml                          LV ESV:       63 ml                          LV SV:        73 ml RIGHT VENTRICLE RV Basal diam:  3.11 cm RV S prime:     11.40  cm/s TAPSE (M-mode): 3.4 cm LEFT ATRIUM             Index        RIGHT ATRIUM           Index LA diam:        2.70 cm 1.42 cm/m   RA Area:     14.30 cm LA Vol (A2C):   62.0 ml 32.51 ml/m  RA Volume:   35.00 ml  18.35 ml/m LA Vol (A4C):   51.9 ml 27.21 ml/m LA Biplane Vol: 57.5 ml 30.15 ml/m  AORTIC VALVE                    PULMONIC VALVE AV Area (Vmax):    1.94 cm     PV Vmax:        0.59 m/s AV Area (Vmean):   1.70 cm     PV Peak grad:   1.4 mmHg AV Area (VTI):     2.05 cm     RVOT Peak grad: 3 mmHg AV Vmax:           133.00 cm/s AV Vmean:          94.350 cm/s AV VTI:            0.316 m AV Peak Grad:      7.1 mmHg AV Mean Grad:      4.0 mmHg LVOT Vmax:         82.20 cm/s LVOT Vmean:        51.000 cm/s LVOT VTI:          0.206 m LVOT/AV VTI ratio: 0.65  AORTA Ao Root diam: 2.80 cm MITRAL VALVE                TRICUSPID VALVE MV Area (PHT): 3.39 cm     TR Peak grad:  28.1 mmHg MV Area VTI:   1.80 cm     TR Vmax:        265.00 cm/s MV Peak grad:  6.6 mmHg MV Mean grad:  2.0 mmHg     SHUNTS MV Vmax:       1.28 m/s     Systemic VTI:  0.21 m MV Vmean:      63.6 cm/s    Systemic Diam: 2.00 cm MV Decel Time: 224 msec MV E velocity: 86.50 cm/s MV A velocity: 113.00 cm/s MV E/A ratio:  0.77 Kathlyn Sacramento MD Electronically signed by Kathlyn Sacramento MD Signature Date/Time: 10/11/2020/12:28:11 PM    Final    DG HIP OPERATIVE UNILAT W OR W/O PELVIS LEFT  Result Date: 10/18/2020 CLINICAL DATA:  Hip replacement EXAM: OPERATIVE left HIP (WITH PELVIS IF PERFORMED) 3 VIEWS TECHNIQUE: Fluoroscopic spot image(s) were submitted for interpretation post-operatively. COMPARISON:  None. FINDINGS: Three low resolution intraoperative spot views of the left hip. Total fluoroscopy time was 19 seconds. The images demonstrate a left hip replacement with normal alignment IMPRESSION: Intraoperative fluoroscopic assistance provided during left hip replacement Electronically Signed   By: Donavan Foil M.D.   On: 10/18/2020 19:23     Disposition: Discharge disposition: 01-Home or Self Care            Signed: Carlynn Spry ,PA-C 10/20/2020, 9:59 AM

## 2020-10-20 NOTE — Progress Notes (Signed)
Physical Therapy Treatment Patient Details Name: Rebekah Lowery MRN: [id number] DOB: 11-18-1933 Today's Date: 10/20/2020   History of Present Illness Pt is an 87 yo female s/p L THA, anterior approach. PMH of HTN, CVA, masectomy bilaterally.    PT Comments    Pt alert in bed, pleasant and cooperative throughout treatment. Pt notes 4/10 pain at rest and during ambulation. Sit <> stand w/ RW, SUPV requiring cues for keeping RW inside BOS during transitions. Ambulated 200 ft w/ SUPV, RW with improved heel strike and cues for gait velocity and reciprocal weight shifting as able. Pt returned to recliner with no further questions following secession. HHPT remains primary discharge recommendation to continue progressing towards goals. Skilled PT intervention is indicated to address deficits in function, mobility, and to return to PLOF as able.    Recommendations for follow up therapy are one component of a multi-disciplinary discharge planning process, led by the attending physician.  Recommendations may be updated based on patient status, additional functional criteria and insurance authorization.  Follow Up Recommendations  Home health PT;Supervision - Intermittent     Equipment Recommendations  Rolling walker with 5" wheels    Recommendations for Other Services       Precautions / Restrictions Precautions Precautions: Fall Restrictions Weight Bearing Restrictions: Yes LLE Weight Bearing: Weight bearing as tolerated     Mobility  Bed Mobility Overal bed mobility: Needs Assistance Bed Mobility: Sit to Supine     Supine to sit: Supervision;HOB elevated          Transfers Overall transfer level: Needs assistance Equipment used: Rolling walker (2 wheeled) Transfers: Sit to/from Stand Sit to Stand: Min guard         General transfer comment: no cues for hand placement, reminders to keep RW inside BOS  Ambulation/Gait Ambulation/Gait assistance: Min guard Gait Distance  (Feet): 180 Feet Assistive device: Rolling walker (2 wheeled) Gait Pattern/deviations: Step-through pattern;Decreased step length - left;Decreased step length - right Gait velocity: decreased   General Gait Details: tactile assist to RW to improve fluidity of movement and improved weight shift, pt able to utilize cues without further assistance   Stairs             Wheelchair Mobility    Modified Rankin (Stroke Patients Only)       Balance Overall balance assessment: Needs assistance Sitting-balance support: Feet supported Sitting balance-Leahy Scale: Good     Standing balance support: No upper extremity supported;Bilateral upper extremity supported Standing balance-Leahy Scale: Fair Standing balance comment: able to stand statically without UE support w/o LOB, requires BUE for amb                            Cognition Arousal/Alertness: Awake/alert Behavior During Therapy: WFL for tasks assessed/performed Overall Cognitive Status: Within Functional Limits for tasks assessed                                        Exercises Total Joint Exercises Heel Slides: AROM;10 reps;Both Hip ABduction/ADduction: AROM;10 reps;Both Other Exercises Other Exercises: Bed > toilet, RW and perform pericare without physical assist by mini squat w/ fwd trunk flexion    General Comments        Pertinent Vitals/Pain Pain Assessment: 0-10 Pain Score: 4  Pain Location: L hip Pain Descriptors / Indicators: Sore;Aching Pain Intervention(s): Limited activity within patient's  tolerance;Monitored during session;Repositioned;Ice applied    Home Living                      Prior Function            PT Goals (current goals can now be found in the care plan section) Progress towards PT goals: Progressing toward goals    Frequency    BID      PT Plan Current plan remains appropriate    Co-evaluation              AM-PAC PT "6  Clicks" Mobility   Outcome Measure  Help needed turning from your back to your side while in a flat bed without using bedrails?: None Help needed moving from lying on your back to sitting on the side of a flat bed without using bedrails?: A Little Help needed moving to and from a bed to a chair (including a wheelchair)?: A Little Help needed standing up from a chair using your arms (e.g., wheelchair or bedside chair)?: A Little Help needed to walk in hospital room?: A Little Help needed climbing 3-5 steps with a railing? : A Little 6 Click Score: 19    End of Session Equipment Utilized During Treatment: Gait belt Activity Tolerance: Patient tolerated treatment well Patient left: with call bell/phone within reach;in chair;with SCD's reapplied;with chair alarm set Nurse Communication: Mobility status PT Visit Diagnosis: Other abnormalities of gait and mobility (R26.89);Pain;Difficulty in walking, not elsewhere classified (R26.2) Pain - Right/Left: Left Pain - part of body: Hip     Time: 0821-0852 PT Time Calculation (min) (ACUTE ONLY): 31 min  Charges:                        [employer], SPT

## 2020-10-20 NOTE — Progress Notes (Signed)
Discharge instructions reviewed with pt. Pt verbalized understanding. Personal belongings returned to pt.  Pt escorted out by nursing staff

## 2020-10-20 NOTE — Discharge Instructions (Signed)

## 2020-10-20 NOTE — Progress Notes (Signed)
  Subjective:  Patient reports pain as mild to moderate.    Objective:   VITALS:   Vitals:   10/19/20 0819 10/19/20 1123 10/19/20 1617 10/19/20 2037  BP: (!) 145/60 132/62 (!) 130/45 (!) 161/67  Pulse: 72 75 71 71  Resp: $Remo'16 18 20 18  'hXsDQ$ Temp: 98 F (36.7 C) 98.5 F (36.9 C) 97.9 F (36.6 C) 98.9 F (37.2 C)  TempSrc:    Oral  SpO2: 99% 97% 98% 100%  Weight:      Height:        PHYSICAL EXAM:  Neurologically intact ABD soft Neurovascular intact Sensation intact distally Intact pulses distally Dorsiflexion/Plantar flexion intact Incision: dressing C/D/I No cellulitis present Compartment soft  LABS  Results for orders placed or performed during the hospital encounter of 10/18/20 (from the past 24 hour(s))  CBC     Status: Abnormal   Collection Time: 10/20/20  3:12 AM  Result Value Ref Range   WBC 8.7 4.0 - 10.5 K/uL   RBC 3.63 (L) 3.87 - 5.11 MIL/uL   Hemoglobin 10.0 (L) 12.0 - 15.0 g/dL   HCT 29.5 (L) 36.0 - 46.0 %   MCV 81.3 80.0 - 100.0 fL   MCH 27.5 26.0 - 34.0 pg   MCHC 33.9 30.0 - 36.0 g/dL   RDW 14.9 11.5 - 15.5 %   Platelets 216 150 - 400 K/uL   nRBC 0.0 0.0 - 0.2 %    DG HIP OPERATIVE UNILAT W OR W/O PELVIS LEFT  Result Date: 10/18/2020 CLINICAL DATA:  Hip replacement EXAM: OPERATIVE left HIP (WITH PELVIS IF PERFORMED) 3 VIEWS TECHNIQUE: Fluoroscopic spot image(s) were submitted for interpretation post-operatively. COMPARISON:  None. FINDINGS: Three low resolution intraoperative spot views of the left hip. Total fluoroscopy time was 19 seconds. The images demonstrate a left hip replacement with normal alignment IMPRESSION: Intraoperative fluoroscopic assistance provided during left hip replacement Electronically Signed   By: Donavan Foil M.D.   On: 10/18/2020 19:23    Assessment/Plan: 2 Days Post-Op   Active Problems:   History of total hip replacement, left   Advance diet Up with therapy Discharge home today with HHPT after PT goals  met   Carlynn Spry , PA-C 10/20/2020, 6:59 AM

## 2021-02-15 ENCOUNTER — Encounter (INDEPENDENT_AMBULATORY_CARE_PROVIDER_SITE_OTHER): Payer: Self-pay | Admitting: Vascular Surgery

## 2021-02-15 ENCOUNTER — Ambulatory Visit (INDEPENDENT_AMBULATORY_CARE_PROVIDER_SITE_OTHER): Payer: Medicare Other | Admitting: Vascular Surgery

## 2021-02-15 ENCOUNTER — Other Ambulatory Visit: Payer: Self-pay

## 2021-02-15 VITALS — BP 180/70 | HR 73 | Resp 15 | Wt 177.4 lb

## 2021-02-15 DIAGNOSIS — M7989 Other specified soft tissue disorders: Secondary | ICD-10-CM | POA: Diagnosis not present

## 2021-02-15 DIAGNOSIS — I10 Essential (primary) hypertension: Secondary | ICD-10-CM

## 2021-02-15 DIAGNOSIS — I83813 Varicose veins of bilateral lower extremities with pain: Secondary | ICD-10-CM | POA: Diagnosis not present

## 2021-02-15 DIAGNOSIS — E119 Type 2 diabetes mellitus without complications: Secondary | ICD-10-CM | POA: Diagnosis not present

## 2021-02-15 NOTE — Assessment & Plan Note (Signed)
New pain and swelling in the legs bilaterally.  Going to check a venous reflux study as well as ABIs in the near future at her convenience to assess her perfusion status as well as her venous return to ensure that a vascular source is not a primary culprit.  It may be more neuropathic pain from what she describes, but she does have a little more prominent swelling and discoloration than she did last year.

## 2021-02-15 NOTE — Progress Notes (Signed)
MRN : [id number]  Rebekah Lowery is a 88 y.o. (01-05-1933) female who presents with chief complaint of  Chief Complaint  Patient presents with   Follow-up    4 week post laser  .  History of Present Illness: Patient returns today in follow up of leg pain and swelling.  She underwent laser ablation of both great saphenous veins about a year ago and initially did pretty well.  Over the past couple of months, she is noticing more of a pins and needle sensation in her feet particularly on the left.  No open wounds or infection.  No fevers or chills.  This is associated with some slightly increased swelling and discoloration as well.  She is continue to wear her compression stockings every day.  No open wounds or infection.  Current Outpatient Medications  Medication Sig Dispense Refill   acetaminophen (TYLENOL) 500 MG tablet Take 500 mg by mouth every 6 (six) hours as needed for moderate pain or mild pain.     cyanocobalamin 1000 MCG tablet Take 1,000 mcg by mouth daily.     docusate sodium (COLACE) 100 MG capsule Take 1 capsule (100 mg total) by mouth 2 (two) times daily. 30 capsule 0   HYDROcodone-acetaminophen (NORCO/VICODIN) 5-325 MG tablet Take 1 tablet by mouth every 4 (four) hours as needed for moderate pain (pain score 4-6). 30 tablet 0   lisinopril (ZESTRIL) 10 MG tablet Take 10 mg by mouth daily.  1   glipiZIDE (GLUCOTROL XL) 5 MG 24 hr tablet Take 5 mg by mouth daily with breakfast. (Patient not taking: Reported on 02/15/2021)     hydrochlorothiazide (HYDRODIURIL) 25 MG tablet Take 25 mg by mouth daily. (Patient not taking: Reported on 10/18/2020)     rivaroxaban (XARELTO) 10 MG TABS tablet Take 1 tablet (10 mg total) by mouth daily. (Patient not taking: Reported on 02/15/2021) 10 tablet 0   No current facility-administered medications for this visit.    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   MVP (mitral valve prolapse)    PAD (peripheral artery disease) ([place])    Stroke ([place]) 04/15/09    Past Surgical History:  Procedure Laterality Date   ABDOMINAL HYSTERECTOMY     BREAST SURGERY  01/29/1990   mastectomy of left   BREAST SURGERY  09/12/2005   partial mastectomy of right   BREAST SURGERY  2013   Right Mastectomy   CATARACT EXTRACTION W/PHACO Right 08/03/2014   Procedure: CATARACT EXTRACTION PHACO AND INTRAOCULAR LENS PLACEMENT (IOC);  Surgeon: Estill Cotta, MD;  Location: [facility];  Service: Ophthalmology;  Laterality: Right;  [place]: 01:17 AP%: 24.0 CDE: 32.46  Fluid Lot# [id number]    COLONOSCOPY  2010   EYE SURGERY     LAPAROSCOPIC HYSTERECTOMY  1982   MASTECTOMY     BIL   TONSILLECTOMY     TOTAL HIP ARTHROPLASTY Left 10/18/2020   Procedure: TOTAL HIP ARTHROPLASTY ANTERIOR APPROACH;  Surgeon: Lovell Sheehan, MD;  Location: [facility] OR[facility];  Service: Orthopedics;  Laterality: Left;   Venacure for Vericose vein  03/2020   WRIST  SURGERY       Social History   Tobacco Use   Smoking status: Never   Smokeless tobacco: Never  Vaping Use   Vaping Use: Never used  Substance Use Topics   Alcohol use: No    Alcohol/week: 0.0 standard drinks   Drug use: No      Family History  Problem Relation Age of Onset   Cancer Sister        breast   Cancer Maternal Aunt        breast  No bleeding or clotting disorders  Allergies  Allergen Reactions   Ibuprofen Other (See Comments)    Gi- upset   Other     Cheese causes hypertension and constipation   Aspirin Swelling and Other (See Comments)    Swelling of lips, caused pain also.   Citric Acid Swelling and Rash    Swelling all over the body.   Codeine Itching, Swelling and Rash     Swelling was all over the body.   Lemon Juice Swelling and Rash    Allergic to lemons in general.   Lemon Oil Rash and Swelling    Allergic to lemons in general.   Monosodium Glutamate Other (See Comments)    Causes spike in bp, nausea, constipation, gi upset.   Peanut-Containing Drug Products Nausea Only    Gi- upset    REVIEW OF SYSTEMS (Negative unless checked)   Constitutional: [] Weight loss  [] Fever  [] Chills Cardiac: [] Chest pain   [] Chest pressure   [] Palpitations   [] Shortness of breath when laying flat   [] Shortness of breath at rest   [] Shortness of breath with exertion. Vascular:  [] Pain in legs with walking   [] Pain in legs at rest   [] Pain in legs when laying flat   [] Claudication   [] Pain in feet when walking  [] Pain in feet at rest  [] Pain in feet when laying flat   [] History of DVT   [] Phlebitis   [x] Swelling in legs   [x] Varicose veins   [] Non-healing ulcers Pulmonary:   [] Uses home oxygen   [] Productive cough   [] Hemoptysis   [] Wheeze  [] COPD   [] Asthma Neurologic:  [] Dizziness  [] Blackouts   [] Seizures   [] History of stroke   [] History of TIA  [] Aphasia   [] Temporary blindness   [] Dysphagia   [] Weakness or numbness in arms   [] Weakness or numbness in legs Musculoskeletal:  [x] Arthritis   [] Joint swelling   [x] Joint pain   [] Low back pain Hematologic:  [] Easy bruising  [] Easy bleeding   [] Hypercoagulable state   [] Anemic   Gastrointestinal:  [] Blood in stool   [] Vomiting blood  [] Gastroesophageal reflux/heartburn   [] Abdominal pain Genitourinary:  [] Chronic kidney disease   [] Difficult urination  [] Frequent urination  [] Burning with urination   [] Hematuria Skin:  [] Rashes   [] Ulcers   [] Wounds Psychological:  [] History of anxiety   []  History of major depression.  Physical Examination  BP (!) 180/70 (BP Location: Right Arm)    Pulse 73    Resp 15    Wt 177 lb 6.4 oz (80.5 kg)    BMI 26.97 kg/m  Gen:  WD/WN, NAD Head: [place]/AT, No temporalis wasting. Ear/Nose/Throat:  Hearing grossly intact, nares w/o erythema or drainage Eyes: Conjunctiva clear. Sclera non-icteric Neck: Supple.  Trachea midline Pulmonary:  Good air movement, no use of accessory muscles.  Cardiac: RRR, no JVD Vascular:  Vessel Right Left  Radial Palpable Palpable  PT 1+ palpable 1+ palpable  DP 1+ palpable 1+ palpable   Gastrointestinal: soft, non-tender/non-distended. No guarding/reflex.  Musculoskeletal: M/S 5/5 throughout.  No deformity or atrophy.  Trace right lower extremity edema, 1+ left lower extremity edema.  Fairly prominent varicosities more so on the left than the right with some stasis dermatitis changes worse on the left than the right Neurologic: Sensation grossly intact in extremities.  Symmetrical.  Speech is fluent.  Psychiatric: Judgment intact, Mood & affect appropriate for pt's clinical situation. Dermatologic: No rashes or ulcers noted.  No cellulitis or open wounds.      Labs No results found for this or any previous visit (from the past 2160 hour(s)).  Radiology No results found.  Assessment/Plan Essential hypertension blood pressure control important in reducing the progression of atherosclerotic disease. On appropriate oral medications.     Swelling of limb Improved but not entirely resolved after laser ablation of the great saphenous vein bilaterally.  Compression socks have helped as well, but this is a little worse than it was last year.   Diabetes ([place]) blood glucose control important in reducing the progression of atherosclerotic disease. Also, involved in wound healing. On appropriate medications.  Varicose veins of leg with pain, bilateral New pain and swelling in the legs bilaterally.  Going to check a venous reflux study as well as ABIs in the near future at her convenience to assess her perfusion status as well as her venous return to ensure that a vascular source is not a primary culprit.  It may be more  neuropathic pain from what she describes, but she does have a little more prominent swelling and discoloration than she did last year.    Leotis Pain, MD  02/15/2021 12:10 PM    This note was created with Dragon medical transcription system.  Any errors from dictation are purely unintentional

## 2021-04-08 ENCOUNTER — Ambulatory Visit (INDEPENDENT_AMBULATORY_CARE_PROVIDER_SITE_OTHER): Payer: Medicare Other | Admitting: Vascular Surgery

## 2021-04-12 ENCOUNTER — Encounter (INDEPENDENT_AMBULATORY_CARE_PROVIDER_SITE_OTHER): Payer: Self-pay | Admitting: Nurse Practitioner

## 2021-04-12 ENCOUNTER — Ambulatory Visit (INDEPENDENT_AMBULATORY_CARE_PROVIDER_SITE_OTHER): Payer: Medicare Other

## 2021-04-12 ENCOUNTER — Ambulatory Visit (INDEPENDENT_AMBULATORY_CARE_PROVIDER_SITE_OTHER): Payer: Medicare Other | Admitting: Nurse Practitioner

## 2021-04-12 VITALS — BP 155/71 | HR 66 | Resp 17 | Ht 68.0 in | Wt 174.6 lb

## 2021-04-12 DIAGNOSIS — I83813 Varicose veins of bilateral lower extremities with pain: Secondary | ICD-10-CM

## 2021-04-12 DIAGNOSIS — I10 Essential (primary) hypertension: Secondary | ICD-10-CM

## 2021-04-12 DIAGNOSIS — E119 Type 2 diabetes mellitus without complications: Secondary | ICD-10-CM

## 2021-04-12 DIAGNOSIS — M7989 Other specified soft tissue disorders: Secondary | ICD-10-CM | POA: Diagnosis not present

## 2021-04-23 ENCOUNTER — Encounter (INDEPENDENT_AMBULATORY_CARE_PROVIDER_SITE_OTHER): Payer: Self-pay | Admitting: Nurse Practitioner

## 2021-04-23 NOTE — Progress Notes (Signed)
? ?Subjective:  ? ? Patient ID: Rebekah Lowery, female    DOB: Dec 12, 1933, 88 y.o.   MRN: [id number] ?No chief complaint on file. ? ? ?Patient returns today in follow up of leg pain and swelling.  She underwent laser ablation of both great saphenous veins about a year ago and initially did pretty well.  Over the past couple of months, she is noticing more of a pins and needle sensation in her feet particularly on the left.  No open wounds or infection.  No fevers or chills.  This is associated with some slightly increased swelling and discoloration as well.  She is continue to wear her compression stockings every day.  No open wounds or infection. ? ?Today noninvasive studies show an ABI of 1.08 on the right and 1.19 on the left.  She has a TBI 0.83 on the right and 0.80 on the left.  She has strong triphasic tibial artery waveforms with normal toe waveforms bilaterally. ? ?Today no evidence of DVT or superficial thrombophlebitis is noted bilaterally.  No evidence of deep venous insufficiency is noted bilaterally.  No evidence of superficial venous reflux in the great or short saphenous vein noted bilaterally.  The previous great saphenous vein ablations are intact. ? ? ?Review of Systems  ?Cardiovascular:  Positive for leg swelling.  ?All other systems reviewed and are negative. ? ?   ?Objective:  ? Physical Exam ?Vitals reviewed.  ?HENT:  ?   Head: Normocephalic.  ?Cardiovascular:  ?   Rate and Rhythm: Normal rate.  ?   Pulses: Normal pulses.  ?Pulmonary:  ?   Effort: Pulmonary effort is normal.  ?Musculoskeletal:  ?   Left lower leg: Edema present.  ?Skin: ?   General: Skin is warm and dry.  ?Neurological:  ?   Mental Status: She is alert and oriented to person, place, and time.  ?Psychiatric:     ?   Mood and Affect: Mood normal.     ?   Behavior: Behavior normal.     ?   Thought Content: Thought content normal.     ?   Judgment: Judgment normal.  ? ? ?BP (!) 155/71 (BP Location: Right Arm)   Pulse 66   Resp  17   Ht 5' 8"  (1.727 m)   Wt 174 lb 9.6 oz (79.2 kg)   BMI 26.55 kg/m?  ? ?Past Medical History:  ?Diagnosis Date  ? Anemia   ? Cancer [facility])   ? left breast  ? Change in voice   ? Diabetes mellitus   ? Family history of lung cancer   ? Family history of pancreatic cancer   ? Hypertension   ? Malignant neoplasm of breast (female), unspecified site 12/11/2011  ? 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  ? MVP (mitral valve prolapse)   ? PAD (peripheral artery disease) ([place])   ? Stroke [facility]) 04/15/09  ? ? ?Social History  ? ?Socioeconomic History  ? Marital status: Divorced  ?  Spouse name: Not on file  ? Number of children: Not on file  ? Years of education: Not on file  ? Highest education level: Not on file  ?Occupational History  ? Not on file  ?Tobacco Use  ? Smoking status: Never  ? Smokeless tobacco: Never  ?Vaping Use  ? Vaping Use: Never used  ?Substance and Sexual Activity  ? Alcohol use: No  ?  Alcohol/week: 0.0 standard drinks  ?  Drug use: No  ? Sexual activity: Not on file  ?Other Topics Concern  ? Not on file  ?Social History Narrative  ? Not on file  ? ?Social Determinants of Health  ? ?Financial Resource Strain: Not on file  ?Food Insecurity: Not on file  ?Transportation Needs: Not on file  ?Physical Activity: Not on file  ?Stress: Not on file  ?Social Connections: Not on file  ?Intimate Partner Violence: Not on file  ? ? ?Past Surgical History:  ?Procedure Laterality Date  ? ABDOMINAL HYSTERECTOMY    ? BREAST SURGERY  01/29/1990  ? mastectomy of left  ? BREAST SURGERY  09/12/2005  ? partial mastectomy of right  ? BREAST SURGERY  2013  ? Right Mastectomy  ? CATARACT EXTRACTION W/PHACO Right 08/03/2014  ? Procedure: CATARACT EXTRACTION PHACO AND INTRAOCULAR LENS PLACEMENT (IOC);  Surgeon: Estill Cotta, MD;  Location: [facility] OR[facility];  Service: Ophthalmology;  Laterality: Right;  [place]: 01:17 ?AP%: 24.0 ?CDE: 32.46 ? ?Fluid Lot# [id number] H   ? COLONOSCOPY  2010  ? EYE  SURGERY    ? [place]  ? MASTECTOMY    ? BIL  ? TONSILLECTOMY    ? TOTAL HIP ARTHROPLASTY Left 10/18/2020  ? Procedure: TOTAL HIP ARTHROPLASTY ANTERIOR APPROACH;  Surgeon: Lovell Sheehan, MD;  Location: [facility] OR[facility];  Service: Orthopedics;  Laterality: Left;  ? Venacure for Vericose vein  03/2020  ? WRIST SURGERY    ? ? ?Family History  ?Problem Relation Age of Onset  ? Cancer Sister   ?     breast  ? Cancer Maternal Aunt   ?     breast  ? ? ?Allergies  ?Allergen Reactions  ? Ibuprofen Other (See Comments)  ?  Gi- upset  ? Other   ?  Cheese causes hypertension and constipation  ? Aspirin Swelling and Other (See Comments)  ?  Swelling of lips, caused pain also.  ? Citric Acid Swelling and Rash  ?  Swelling all over the body.  ? Codeine Itching, Swelling and Rash  ?  Swelling was all over the body.  ? Lemon Juice Swelling and Rash  ?  Allergic to lemons in general.  ? Lemon Oil Rash and Swelling  ?  Allergic to lemons in general.  ? Monosodium Glutamate Other (See Comments)  ?  Causes spike in bp, nausea, constipation, gi upset.  ? Peanut-Containing Drug Products Nausea Only  ?  Gi- upset  ? ? ? ?  Latest Ref Rng & Units 10/20/2020  ?  3:12 AM 10/19/2020  ?  5:04 AM 10/18/2020  ?  8:21 PM  ?CBC  ?WBC 4.0 - 10.5 K/uL 8.7   9.0   10.4    ?Hemoglobin 12.0 - 15.0 g/dL 10.0   10.6   12.0    ?Hematocrit 36.0 - 46.0 % 29.5   31.8   36.1    ?Platelets 150 - 400 K/uL 216   225   255    ? ? ? ? ?CMP  ?   ?Component Value Date/Time  ? NA 135 10/19/2020 0504  ? NA 139 04/19/2014 2354  ? K 4.1 10/19/2020 0504  ? K 4.0 04/19/2014 2354  ? CL 104 10/19/2020 0504  ? CL 106 04/19/2014 2354  ? CO2 25 10/19/2020 0504  ? CO2 28 04/19/2014 2354  ? GLUCOSE 233 (H) 10/19/2020 0504  ? GLUCOSE 146 (H) 04/19/2014 2354  ? BUN 17 10/19/2020 0504  ? BUN 19 04/19/2014 2354  ?  CREATININE 0.89 10/19/2020 0504  ? CREATININE 1.07 (H) 04/19/2014 2354  ? CALCIUM 8.2 (L) 10/19/2020 0504  ? CALCIUM 8.7 (L) 04/19/2014 2354  ? PROT 7.6  03/30/2017 1916  ? PROT 8.0 04/19/2014 2354  ? ALBUMIN 3.9 03/30/2017 1916  ? ALBUMIN 4.0 04/19/2014 2354  ? AST 17 03/30/2017 1916  ? AST 17 04/19/2014 2354  ? ALT 16 03/30/2017 1916  ? ALT 14 04/19/2014 2354  ? ALKPHOS 65 03/30/2017 1916  ? ALKPHOS 48 04/19/2014 2354  ? BILITOT 0.6 03/30/2017 1916  ? BILITOT 0.3 04/19/2014 2354  ? GFRNONAA >60 10/19/2020 0504  ? GFRNONAA 49 (L) 04/19/2014 2354  ? GFRAA 59 (L) 03/30/2017 1916  ? GFRAA 57 (L) 04/19/2014 2354  ? ? ? ?VAS [place] ABI WITH/WO TBI ? ?Result Date: 04/20/2021 ? LOWER EXTREMITY DOPPLER STUDY Patient Name:  Jaxsyn Catalfamo Allie  Date of Exam:   04/12/2021 Medical Rec #: [id number]         Accession #:    [id number] Date of Birth: November 06, 1933          Patient Gender: F Patient Age:   42 years Exam Location:  [facility] Procedure:      VAS [place] ABI WITH/WO TBI Referring Phys: --------------------------------------------------------------------------------  Other Factors: Swelling bilat legs.  Performing Technologist: Concha Norway RVT  Examination Guidelines: A complete evaluation includes at minimum, Doppler waveform signals and systolic blood pressure reading at the level of bilateral brachial, anterior tibial, and posterior tibial arteries, when vessel segments are accessible. Bilateral testing is considered an integral part of a complete examination. Photoelectric Plethysmograph (PPG) waveforms and toe systolic pressure readings are included as required and additional duplex testing as needed. Limited examinations for reoccurring indications may be performed as noted.  ABI Findings: +---------+------------------+-----+---------+--------+ Right    Rt Pressure (mmHg)IndexWaveform Comment  +---------+------------------+-----+---------+--------+ Brachial 132                                      +---------+------------------+-----+---------+--------+ ATA      140               1.06 triphasic          +---------+------------------+-----+---------+--------+ PTA      142               1.08 triphasic         +---------+------------------+-----+---------+--------+ Great Toe110               0.83 Normal            +---------+------------------+-----+---------+--------+ +-------

## 2021-10-31 ENCOUNTER — Encounter (INDEPENDENT_AMBULATORY_CARE_PROVIDER_SITE_OTHER): Payer: Self-pay

## 2021-11-16 DIAGNOSIS — E1140 Type 2 diabetes mellitus with diabetic neuropathy, unspecified: Secondary | ICD-10-CM | POA: Insufficient documentation

## 2021-12-15 ENCOUNTER — Telehealth (INDEPENDENT_AMBULATORY_CARE_PROVIDER_SITE_OTHER): Payer: Self-pay | Admitting: Vascular Surgery

## 2021-12-15 NOTE — Telephone Encounter (Signed)
Vaughan Basta with [facility] (new PCP office [phone]). states that patient is having bilateral LE pain + mild swelling 2 weeks to a month. No falls or direct injuries. We can call back to office to schedule pt. Please advise.

## 2021-12-16 NOTE — Telephone Encounter (Signed)
LVM for Vaughan Basta to call back and make an appt for pt.   per phone encounter: F/U regarding bilateral LE pain + mild swelling 2 weeks - month. no studies.   Need to also remind the facility that pt should be utilizing medical grade compression stockings as discussed.

## 2021-12-16 NOTE — Telephone Encounter (Signed)
The patient should be reminded to utilize her medical grade compression as discussed at her previous office visit.  We can have her follow-up after the holidays.

## 2022-01-06 ENCOUNTER — Ambulatory Visit (INDEPENDENT_AMBULATORY_CARE_PROVIDER_SITE_OTHER): Payer: Medicare Other | Admitting: Nurse Practitioner

## 2022-01-06 ENCOUNTER — Encounter (INDEPENDENT_AMBULATORY_CARE_PROVIDER_SITE_OTHER): Payer: Self-pay | Admitting: Nurse Practitioner

## 2022-01-06 VITALS — BP 152/75 | HR 68 | Resp 16 | Ht 68.0 in | Wt 177.0 lb

## 2022-01-06 DIAGNOSIS — R200 Anesthesia of skin: Secondary | ICD-10-CM | POA: Diagnosis not present

## 2022-01-06 DIAGNOSIS — I10 Essential (primary) hypertension: Secondary | ICD-10-CM

## 2022-01-06 DIAGNOSIS — R202 Paresthesia of skin: Secondary | ICD-10-CM

## 2022-01-06 DIAGNOSIS — I83813 Varicose veins of bilateral lower extremities with pain: Secondary | ICD-10-CM | POA: Diagnosis not present

## 2022-01-10 ENCOUNTER — Encounter (INDEPENDENT_AMBULATORY_CARE_PROVIDER_SITE_OTHER): Payer: Self-pay | Admitting: Nurse Practitioner

## 2022-01-10 NOTE — Progress Notes (Signed)
Subjective:    Patient ID: Rebekah Lowery, female    DOB: Oct 07, 1933, 89 y.o.   MRN: [id number] Chief Complaint  Patient presents with   Follow-up    Bilateral le pain    The patient returns to the office for followup evaluation regarding leg swelling.  The swelling has persisted and the pain associated with swelling continues. There have not been any interval development of a ulcerations or wounds.  Since the previous visit the patient has been wearing graduated compression stockings and has noted little if any improvement in the lymphedema. The patient has been using compression routinely morning until night.  The patient has had previous ablations of her great saphenous veins.  Previous ABIs are normal.  The patient also states elevation during the day and exercise is being done too.      Review of Systems  Cardiovascular:  Positive for leg swelling.  Neurological:  Positive for numbness.  All other systems reviewed and are negative.      Objective:   Physical Exam Vitals reviewed.  HENT:     Head: Normocephalic.  Cardiovascular:     Rate and Rhythm: Normal rate.  Pulmonary:     Effort: Pulmonary effort is normal.  Skin:    General: Skin is warm and dry.  Neurological:     Mental Status: She is alert and oriented to person, place, and time.  Psychiatric:        Mood and Affect: Mood normal.        Behavior: Behavior normal.        Thought Content: Thought content normal.        Judgment: Judgment normal.    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   MVP (mitral valve prolapse)    PAD (peripheral artery disease) (HCC)    Stroke ([place]) 04/15/09    Social History   Socioeconomic History   Marital status: Divorced    Spouse name: Not on file   Number of children: Not on file   Years of education: Not on file   Highest education level: Not on file  Occupational History   Not on file  Tobacco Use   Smoking status: Never   Smokeless tobacco: Never  Vaping Use   Vaping Use: Never used  Substance and Sexual Activity   Alcohol use: No    Alcohol/week: 0.0 standard drinks of alcohol   Drug use: No   Sexual activity: Not on file  Other Topics Concern   Not on file  Social History Narrative   Not on file   Social Determinants of Health   Financial Resource Strain: Not on file  Food Insecurity: Not on file  Transportation Needs: Not on file  Physical Activity: Not on file  Stress: Not on file  Social Connections: Not on file  Intimate Partner Violence: Not on file    Past  Surgical History:  Procedure Laterality Date   ABDOMINAL HYSTERECTOMY     BREAST SURGERY  01/29/1990   mastectomy of left   BREAST SURGERY  09/12/2005   partial mastectomy of right   BREAST SURGERY  2013   Right Mastectomy   CATARACT EXTRACTION W/PHACO Right 08/03/2014   Procedure: CATARACT EXTRACTION PHACO AND INTRAOCULAR LENS PLACEMENT (IOC);  Surgeon: Estill Cotta, MD;  Location: [facility] ORS;  Service: Ophthalmology;  Laterality: Right;  [place]: 01:17 AP%: 24.0 CDE: 32.46  Fluid Lot# [id number]    COLONOSCOPY  2010   EYE SURGERY     LAPAROSCOPIC HYSTERECTOMY  1982   MASTECTOMY     BIL   TONSILLECTOMY     TOTAL HIP ARTHROPLASTY Left 10/18/2020   Procedure: TOTAL HIP ARTHROPLASTY ANTERIOR APPROACH;  Surgeon: Lovell Sheehan, MD;  Location: [facility] ORS;  Service: Orthopedics;  Laterality: Left;   Venacure for Vericose vein  03/2020   WRIST SURGERY      Family History  Problem Relation Age of Onset   Cancer Sister         breast   Cancer Maternal Aunt        breast    Allergies  Allergen Reactions   Ibuprofen Other (See Comments)    Gi- upset   Other     Cheese causes hypertension and constipation   Aspirin Swelling and Other (See Comments)    Swelling of lips, caused pain also.   Citric Acid Swelling and Rash    Swelling all over the body.   Codeine Itching, Swelling and Rash    Swelling was all over the body.   Lemon Juice Swelling and Rash    Allergic to lemons in general.   Lemon Oil Rash and Swelling    Allergic to lemons in general.   Monosodium Glutamate Other (See Comments)    Causes spike in bp, nausea, constipation, gi upset.   Peanut-Containing Drug Products Nausea Only    Gi- upset       Latest Ref Rng & Units 10/20/2020    3:12 AM 10/19/2020    5:04 AM 10/18/2020    8:21 PM  CBC  WBC 4.0 - 10.5 K/uL 8.7  9.0  10.4   Hemoglobin 12.0 - 15.0 g/dL 10.0  10.6  12.0   Hematocrit 36.0 - 46.0 % 29.5  31.8  36.1   Platelets 150 - 400 K/uL 216  225  255       CMP     Component Value Date/Time   NA 135 10/19/2020 0504   NA 139 04/19/2014 2354   K 4.1 10/19/2020 0504   K 4.0 04/19/2014 2354   CL 104 10/19/2020 0504   CL 106 04/19/2014 2354   CO2 25 10/19/2020 0504   CO2 28 04/19/2014 2354   GLUCOSE 233 (H) 10/19/2020 0504   GLUCOSE 146 (H) 04/19/2014 2354   BUN 17 10/19/2020 0504   BUN 19 04/19/2014 2354   CREATININE 0.89 10/19/2020 0504   CREATININE 1.07 (H) 04/19/2014 2354   CALCIUM 8.2 (L) 10/19/2020 0504   CALCIUM 8.7 (L) 04/19/2014 2354   PROT 7.6 03/30/2017 1916   PROT 8.0 04/19/2014 2354   ALBUMIN 3.9 03/30/2017 1916   ALBUMIN 4.0 04/19/2014 2354   AST 17 03/30/2017 1916   AST 17 04/19/2014 2354   ALT 16 03/30/2017 1916   ALT 14 04/19/2014 2354   ALKPHOS 65 03/30/2017 1916   ALKPHOS 48 04/19/2014 2354   BILITOT 0.6 03/30/2017 1916  BILITOT 0.3 04/19/2014 2354   GFRNONAA >60 10/19/2020 0504   GFRNONAA 49 (L) 04/19/2014 2354   GFRAA 59 (L)  03/30/2017 1916   GFRAA 57 (L) 04/19/2014 2354     No results found.     Assessment & Plan:   1. Varicose veins of leg with pain, bilateral Recommend:  The patient has had successful ablation of the previously incompetent saphenous venous system but still has persistent symptoms of pain and swelling that are having a negative impact on daily life and daily activities.  Patient should undergo injection sclerotherapy to treat the residual varicosities.  The risks, benefits and alternative therapies were reviewed in detail with the patient.  All questions were answered.  The patient agrees to proceed with sclerotherapy at their convenience.  The patient will continue wearing the graduated compression stockings and using the over-the-counter pain medications to treat her symptoms.      2. Essential hypertension Continue antihypertensive medications as already ordered, these medications have been reviewed and there are no changes at this time.  3. Numbness and tingling Numbness and tingling in the patient's lower extremities are not caused by the swelling and varicosities but suspect they are driven by underlying neuropathy.  Will have the patient referred to neurology for further evaluation. - Ambulatory referral to Neurology    Current Outpatient Medications on File Prior to Visit  Medication Sig Dispense Refill   acetaminophen (TYLENOL) 500 MG tablet Take 500 mg by mouth every 6 (six) hours as needed for moderate pain or mild pain.     Alcohol Swabs ([employer] PREP) 70 % PADS Apply 1 each topically 2 (two) times daily.     Blood Glucose Calibration (ONETOUCH VERIO) SOLN USE AS DIRECTED WITH GLUCOMETER     cyanocobalamin 1000 MCG tablet Take 1,000 mcg by mouth daily.     docusate sodium (COLACE) 100 MG capsule Take 1 capsule (100 mg total) by mouth 2 (two) times daily. 30 capsule 0   glipiZIDE (GLUCOTROL XL) 5 MG 24 hr tablet Take 5 mg by mouth daily with breakfast.     Lancets  (ONETOUCH DELICA PLUS [id number]) MISC SMARTSIG:Via Meter     lisinopril (ZESTRIL) 10 MG tablet Take 10 mg by mouth daily.  1   ONETOUCH VERIO test strip SMARTSIG:Strip(s) Via Meter Twice Daily     rivaroxaban (XARELTO) 10 MG TABS tablet Take 1 tablet (10 mg total) by mouth daily. 10 tablet 0   No current facility-administered medications on file prior to visit.    There are no Patient Instructions on file for this visit. No follow-ups on file.   Kris Hartmann, NP

## 2022-02-22 ENCOUNTER — Telehealth (INDEPENDENT_AMBULATORY_CARE_PROVIDER_SITE_OTHER): Payer: Self-pay | Admitting: Nurse Practitioner

## 2022-02-22 NOTE — Telephone Encounter (Signed)
LVM for pt TCB and schedule appt  bilateral SALINE sclero. see FBJosem Kaufmann # [id number]. exp: 2.6.24 - 5.6.24 (6 total units)  - needs 3 total appts

## 2022-02-27 ENCOUNTER — Encounter (INDEPENDENT_AMBULATORY_CARE_PROVIDER_SITE_OTHER): Payer: Self-pay | Admitting: Nurse Practitioner

## 2022-02-27 ENCOUNTER — Ambulatory Visit (INDEPENDENT_AMBULATORY_CARE_PROVIDER_SITE_OTHER): Payer: Medicare Other | Admitting: Nurse Practitioner

## 2022-02-27 VITALS — BP 162/79 | HR 79 | Resp 18

## 2022-02-27 DIAGNOSIS — I83813 Varicose veins of bilateral lower extremities with pain: Secondary | ICD-10-CM

## 2022-02-27 NOTE — Progress Notes (Signed)
Varicose veins of bilateral lower extremity with inflammation (454.1  I83.10) Current Plans   Indication: Patient presents with symptomatic varicose veins of the bilateral  lower extremity.   Procedure: Sclerotherapy using hypertonic saline mixed with 1% Lidocaine was performed on the bilateral lower extremity. Compression wraps were placed. The patient tolerated the procedure well. 

## 2022-03-23 ENCOUNTER — Ambulatory Visit (INDEPENDENT_AMBULATORY_CARE_PROVIDER_SITE_OTHER): Payer: Medicare Other | Admitting: Nurse Practitioner

## 2022-03-23 ENCOUNTER — Encounter (INDEPENDENT_AMBULATORY_CARE_PROVIDER_SITE_OTHER): Payer: Self-pay | Admitting: Nurse Practitioner

## 2022-03-23 VITALS — BP 137/73 | HR 72 | Resp 16

## 2022-03-23 DIAGNOSIS — I83813 Varicose veins of bilateral lower extremities with pain: Secondary | ICD-10-CM | POA: Diagnosis not present

## 2022-03-23 NOTE — Progress Notes (Signed)
Varicose veins of bilateral  lower extremity with inflammation (454.1  I83.10) Current Plans   Indication: Patient presents with symptomatic varicose veins of the bilateral  lower extremity.   Procedure: Sclerotherapy using hypertonic saline mixed with 1% Lidocaine was performed on the bilateral lower extremity. Compression wraps were placed. The patient tolerated the procedure well. 

## 2022-03-28 DIAGNOSIS — Z961 Presence of intraocular lens: Secondary | ICD-10-CM | POA: Diagnosis not present

## 2022-03-28 DIAGNOSIS — H16223 Keratoconjunctivitis sicca, not specified as Sjogren's, bilateral: Secondary | ICD-10-CM | POA: Diagnosis not present

## 2022-03-28 DIAGNOSIS — E119 Type 2 diabetes mellitus without complications: Secondary | ICD-10-CM | POA: Diagnosis not present

## 2022-04-04 DIAGNOSIS — Z789 Other specified health status: Secondary | ICD-10-CM | POA: Diagnosis not present

## 2022-04-04 DIAGNOSIS — E119 Type 2 diabetes mellitus without complications: Secondary | ICD-10-CM | POA: Diagnosis not present

## 2022-04-04 DIAGNOSIS — G64 Other disorders of peripheral nervous system: Secondary | ICD-10-CM | POA: Diagnosis not present

## 2022-04-04 DIAGNOSIS — E782 Mixed hyperlipidemia: Secondary | ICD-10-CM | POA: Diagnosis not present

## 2022-04-20 ENCOUNTER — Ambulatory Visit (INDEPENDENT_AMBULATORY_CARE_PROVIDER_SITE_OTHER): Payer: Medicare Other | Admitting: Nurse Practitioner

## 2022-04-25 ENCOUNTER — Ambulatory Visit (INDEPENDENT_AMBULATORY_CARE_PROVIDER_SITE_OTHER): Payer: Medicare Other | Admitting: Nurse Practitioner

## 2022-04-25 ENCOUNTER — Encounter (INDEPENDENT_AMBULATORY_CARE_PROVIDER_SITE_OTHER): Payer: Self-pay | Admitting: Nurse Practitioner

## 2022-04-25 VITALS — BP 176/81 | HR 69 | Resp 18

## 2022-04-25 DIAGNOSIS — I83813 Varicose veins of bilateral lower extremities with pain: Secondary | ICD-10-CM

## 2022-04-25 NOTE — Progress Notes (Signed)
Varicose veins of bilateral  lower extremity with inflammation (454.1  I83.10) Current Plans   Indication: Patient presents with symptomatic varicose veins of the bilateral  lower extremity.   Procedure: Sclerotherapy using hypertonic saline mixed with 1% Lidocaine was performed on the bilateral lower extremity. Compression wraps were placed. The patient tolerated the procedure well. 

## 2022-05-10 DIAGNOSIS — J301 Allergic rhinitis due to pollen: Secondary | ICD-10-CM | POA: Diagnosis not present

## 2022-05-10 DIAGNOSIS — I10 Essential (primary) hypertension: Secondary | ICD-10-CM | POA: Diagnosis not present

## 2022-05-10 DIAGNOSIS — E782 Mixed hyperlipidemia: Secondary | ICD-10-CM | POA: Diagnosis not present

## 2022-05-10 DIAGNOSIS — E1142 Type 2 diabetes mellitus with diabetic polyneuropathy: Secondary | ICD-10-CM | POA: Diagnosis not present

## 2022-07-25 ENCOUNTER — Ambulatory Visit (INDEPENDENT_AMBULATORY_CARE_PROVIDER_SITE_OTHER): Payer: Medicare Other | Admitting: Nurse Practitioner

## 2022-08-01 ENCOUNTER — Encounter (INDEPENDENT_AMBULATORY_CARE_PROVIDER_SITE_OTHER): Payer: Self-pay | Admitting: Nurse Practitioner

## 2022-08-01 ENCOUNTER — Ambulatory Visit (INDEPENDENT_AMBULATORY_CARE_PROVIDER_SITE_OTHER): Payer: Medicare Other | Admitting: Nurse Practitioner

## 2022-08-01 VITALS — BP 158/69 | HR 66 | Resp 18 | Ht 68.5 in | Wt 172.8 lb

## 2022-08-01 DIAGNOSIS — I83813 Varicose veins of bilateral lower extremities with pain: Secondary | ICD-10-CM

## 2022-08-01 DIAGNOSIS — I10 Essential (primary) hypertension: Secondary | ICD-10-CM

## 2022-08-01 DIAGNOSIS — M7989 Other specified soft tissue disorders: Secondary | ICD-10-CM

## 2022-08-01 NOTE — Progress Notes (Signed)
Subjective:    Patient ID: Rebekah Lowery, female    DOB: 07-23-1933, 89 y.o.   MRN: [id number] Chief Complaint  Patient presents with   Follow-up    3 months no studies    Rebekah Lowery is an 89 year old female who returns today for evaluation.  She has had issues with lower extremity edema for years and she recently underwent sclerotherapy to assist with treatment of discomfort and inflammation in the lower legs.  She notes that since her sclerotherapy was done her swelling has been fairly well-controlled with continued conservative therapy treatment.  She notes that the neuropathy of the lower legs has not been worsening for her.  She also has a small circumscribed area that is swollen and slightly tender with palpation.  She notes that it has been there since her last visit with [place] and that it initially began behind her ankle and has since moved to a little bit above her ankle.    Review of Systems  Cardiovascular:  Positive for leg swelling.  All other systems reviewed and are negative.      Objective:   Physical Exam Vitals reviewed.  HENT:     Head: Normocephalic.  Cardiovascular:     Rate and Rhythm: Normal rate.     Pulses: Normal pulses.  Pulmonary:     Effort: Pulmonary effort is normal.  Musculoskeletal:        General: Tenderness present.  Skin:    General: Skin is warm and dry.  Neurological:     Mental Status: She is alert and oriented to person, place, and time.  Psychiatric:        Mood and Affect: Mood normal.        Behavior: Behavior normal.        Thought Content: Thought content normal.        Judgment: Judgment normal.     BP (!) 158/69 (BP Location: Right Arm)   Pulse 66   Resp 18   Ht 5' 8.5" (1.74 m)   Wt 172 lb 12.8 oz (78.4 kg)   BMI 25.89 kg/m   Past Medical History:  Diagnosis Date   Anemia    Cancer (HCC)    left breast   Change in voice    Diabetes mellitus    Family history of lung cancer    Family history of pancreatic  cancer    Hypertension    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   MVP (mitral valve prolapse)    PAD (peripheral artery disease) (HCC)    Stroke ([facility]CC) 04/15/09    Social History   Socioeconomic History   Marital status: Divorced    Spouse name: Not on file   Number of children: Not on file   Years of education: Not on file   Highest education level: Not on file  Occupational History   Not on file  Tobacco Use   Smoking status: Never   Smokeless tobacco: Never  Vaping Use   Vaping status: Never Used  Substance and Sexual Activity   Alcohol use: No    Alcohol/week: 0.0 standard drinks of alcohol   Drug use: No   Sexual activity: Not on file  Other Topics Concern   Not on file  Social History Narrative   Not on file   Social Determinants of Health   Financial Resource Strain: Not on file  Food  Insecurity: Not on file  Transportation Needs: Not on file  Physical Activity: Not on file  Stress: Not on file  Social Connections: Not on file  Intimate Partner Violence: Not on file    Past Surgical History:  Procedure Laterality Date   ABDOMINAL HYSTERECTOMY     BREAST SURGERY  01/29/1990   mastectomy of left   BREAST SURGERY  09/12/2005   partial mastectomy of right   BREAST SURGERY  2013   Right Mastectomy   CATARACT EXTRACTION W/PHACO Right 08/03/2014   Procedure: CATARACT EXTRACTION PHACO AND INTRAOCULAR LENS PLACEMENT (IOC);  Surgeon: Sallee Lange, MD;  Location: [facility];  Service: Ophthalmology;  Laterality: Right;  [place]: 01:17 AP%: 24.0 CDE: 32.46  Fluid Lot# [id number] H    COLONOSCOPY  2010   EYE SURGERY     LAPAROSCOPIC HYSTERECTOMY  1982   MASTECTOMY     BIL   TONSILLECTOMY     TOTAL HIP ARTHROPLASTY Left 10/18/2020   Procedure: TOTAL HIP ARTHROPLASTY ANTERIOR APPROACH;  Surgeon: Lyndle Herrlich, MD;  Location: [facility] OR[facility];  Service:  Orthopedics;  Laterality: Left;   Venacure for Vericose vein  03/2020   WRIST SURGERY      Family History  Problem Relation Age of Onset   Cancer Sister        breast   Cancer Maternal Aunt        breast    Allergies  Allergen Reactions   Strawberry Leaves Extract Hives    Facial and body swelling    Ibuprofen Other (See Comments)    Gi- upset   Other     Cheese causes hypertension and constipation   Aspirin Swelling and Other (See Comments)    Swelling of lips, caused pain also.   Citric Acid Swelling and Rash    Swelling all over the body.   Codeine Itching, Swelling and Rash    Swelling was all over the body.   Lemon Juice Swelling and Rash    Allergic to lemons in general.   Lemon Oil Rash and Swelling    Allergic to lemons in general.   Monosodium Glutamate Other (See Comments)    Causes spike in bp, nausea, constipation, gi upset.   Peanut-Containing Drug Products Nausea Only    Gi- upset       Latest Ref Rng & Units 10/20/2020    3:12 AM 10/19/2020    5:04 AM 10/18/2020    8:21 PM  CBC  WBC 4.0 - 10.5 K/uL 8.7  9.0  10.4   Hemoglobin 12.0 - 15.0 g/dL 47.8  29.5  62.1   Hematocrit 36.0 - 46.0 % 29.5  31.8  36.1   Platelets 150 - 400 K/uL 216  225  255       CMP     Component Value Date/Time   NA 135 10/19/2020 0504   NA 139 04/19/2014 2354   K 4.1 10/19/2020 0504   K 4.0 04/19/2014 2354   CL 104 10/19/2020 0504   CL 106 04/19/2014 2354   CO2 25 10/19/2020 0504   CO2 28 04/19/2014 2354   GLUCOSE 233 (H) 10/19/2020 0504   GLUCOSE 146 (H) 04/19/2014 2354   BUN 17 10/19/2020 0504   BUN 19 04/19/2014 2354   CREATININE 0.89 10/19/2020 0504   CREATININE 1.07 (H) 04/19/2014 2354   CALCIUM 8.2 (L) 10/19/2020 0504   CALCIUM 8.7 (L) 04/19/2014 2354   PROT 7.6 03/30/2017 1916   PROT 8.0 04/19/2014 2354   ALBUMIN  3.9 03/30/2017 1916   ALBUMIN 4.0 04/19/2014 2354   AST 17 03/30/2017 1916   AST 17 04/19/2014 2354   ALT 16 03/30/2017 1916   ALT 14  04/19/2014 2354   ALKPHOS 65 03/30/2017 1916   ALKPHOS 48 04/19/2014 2354   BILITOT 0.6 03/30/2017 1916   BILITOT 0.3 04/19/2014 2354   GFRNONAA >60 10/19/2020 0504   GFRNONAA 49 (L) 04/19/2014 2354     No results found.     Assessment & Plan:   1. Varicose veins of leg with pain, bilateral The patient still has some spider varicosities but since sclerotherapy she has not had worsening swelling or edema and it has been fairly well-controlled with other conservative therapy tactics including compression, elevation and activity.  Overall she is happy with current results.  Will have patient return in 1 year for follow-up evaluation.  2. Essential hypertension Continue antihypertensive medications as already ordered, these medications have been reviewed and there are no changes at this time.  3. Swelling of limb Patient has been swelling in her right lower extremity that is just above her ankle area.  This was visualized with a duplex ultrasound today which showed no evidence of DVT or superficial phlebitis in the area.  It was noted that there was some edematous tissue.  I discussed with patient this may be due to a muscle strain.  If he continues to have pain or issues she is advised to discuss with PCP for evaluation.   Current Outpatient Medications on File Prior to Visit  Medication Sig Dispense Refill   acetaminophen (TYLENOL) 500 MG tablet Take 500 mg by mouth every 6 (six) hours as needed for moderate pain or mild pain.     Alcohol Swabs ([employer] PREP) 70 % PADS Apply 1 each topically 2 (two) times daily.     Blood Glucose Calibration (ONETOUCH VERIO) SOLN USE AS DIRECTED WITH GLUCOMETER     cyanocobalamin 1000 MCG tablet Take 1,000 mcg by mouth daily.     docusate sodium (COLACE) 100 MG capsule Take 1 capsule (100 mg total) by mouth 2 (two) times daily. 30 capsule 0   glipiZIDE (GLUCOTROL XL) 5 MG 24 hr tablet Take 5 mg by mouth daily with breakfast.     JARDIANCE 25 MG TABS tablet  Take 25 mg by mouth daily.     Lancets (ONETOUCH DELICA PLUS LANCET30G) MISC SMARTSIG:Via Meter     lisinopril (ZESTRIL) 10 MG tablet Take 10 mg by mouth daily.  1   ONETOUCH VERIO test strip SMARTSIG:Strip(s) Via Meter Twice Daily     rivaroxaban (XARELTO) 10 MG TABS tablet Take 1 tablet (10 mg total) by mouth daily. (Patient not taking: Reported on 08/01/2022) 10 tablet 0   No current facility-administered medications on file prior to visit.    There are no Patient Instructions on file for this visit. No follow-ups on file.   Georgiana Spinner, NP

## 2022-10-27 ENCOUNTER — Emergency Department
Admission: EM | Admit: 2022-10-27 | Discharge: 2022-10-27 | Disposition: A | Discharge status: Home / Self Care | Payer: Medicare Other | Attending: Emergency Medicine | Admitting: Emergency Medicine

## 2022-10-27 ENCOUNTER — Emergency Department: Payer: Medicare Other

## 2022-10-27 ENCOUNTER — Other Ambulatory Visit: Payer: Self-pay

## 2022-10-27 ENCOUNTER — Encounter: Payer: Self-pay | Admitting: Emergency Medicine

## 2022-10-27 DIAGNOSIS — R519 Headache, unspecified: Secondary | ICD-10-CM

## 2022-10-27 DIAGNOSIS — Z96642 Presence of left artificial hip joint: Secondary | ICD-10-CM | POA: Insufficient documentation

## 2022-10-27 DIAGNOSIS — I10 Essential (primary) hypertension: Secondary | ICD-10-CM | POA: Insufficient documentation

## 2022-10-27 DIAGNOSIS — E1140 Type 2 diabetes mellitus with diabetic neuropathy, unspecified: Secondary | ICD-10-CM | POA: Insufficient documentation

## 2022-10-27 DIAGNOSIS — Z853 Personal history of malignant neoplasm of breast: Secondary | ICD-10-CM | POA: Diagnosis not present

## 2022-10-27 LAB — CBC
HCT: 41.1 % (ref 36.0–46.0)
Hemoglobin: 13 g/dL (ref 12.0–15.0)
MCH: 25.2 pg — ABNORMAL LOW (ref 26.0–34.0)
MCHC: 31.6 g/dL (ref 30.0–36.0)
MCV: 79.8 fL — ABNORMAL LOW (ref 80.0–100.0)
Platelets: 277 10*3/uL (ref 150–400)
RBC: 5.15 MIL/uL — ABNORMAL HIGH (ref 3.87–5.11)
RDW: 14.9 % (ref 11.5–15.5)
WBC: 8 10*3/uL (ref 4.0–10.5)
nRBC: 0 % (ref 0.0–0.2)

## 2022-10-27 LAB — BASIC METABOLIC PANEL
Anion gap: 10 (ref 5–15)
BUN: 20 mg/dL (ref 8–23)
CO2: 26 mmol/L (ref 22–32)
Calcium: 9 mg/dL (ref 8.9–10.3)
Chloride: 99 mmol/L (ref 98–111)
Creatinine, Ser: 0.96 mg/dL (ref 0.44–1.00)
GFR, Estimated: 57 mL/min — ABNORMAL LOW (ref >60)
Glucose, Bld: 312 mg/dL — ABNORMAL HIGH (ref 70–99)
Potassium: 4 mmol/L (ref 3.5–5.1)
Sodium: 135 mmol/L (ref 135–145)

## 2022-10-27 MED ORDER — CLONIDINE HCL 0.1 MG PO TABS
0.1000 mg | ORAL_TABLET | Freq: Once | ORAL | Status: AC
  Administered 2022-10-27: 0.1 mg via ORAL
  Filled 2022-10-27: qty 1

## 2022-10-27 MED ORDER — ACETAMINOPHEN 325 MG PO TABS
650.0000 mg | ORAL_TABLET | Freq: Once | ORAL | Status: AC
  Administered 2022-10-27: 650 mg via ORAL
  Filled 2022-10-27: qty 2

## 2022-10-27 MED ORDER — LISINOPRIL 10 MG PO TABS
20.0000 mg | ORAL_TABLET | Freq: Once | ORAL | Status: AC
  Administered 2022-10-27: 20 mg via ORAL
  Filled 2022-10-27: qty 2

## 2022-10-27 NOTE — ED Provider Notes (Signed)
[facility] Provider Note    Event Date/Time   First MD Initiated Contact with Patient 10/27/22 1235     (approximate)   History   Hypertension and Headache   HPI  Rebekah Lowery is a 89 y.o. female with a history of hypertension and diabetes who presents today for evaluation of headache for the past week.  She reports that her blood pressure has been going up and down.  She has not had any changes to her blood pressure medications.  She denies visual changes, nausea, vomiting, chest pain, or back pain.  Patient Active Problem List   Diagnosis Date Noted   Type 2 diabetes mellitus with diabetic neuropathy, unspecified (HCC) 11/16/2021   History of total hip replacement, left 10/18/2020   Varicose veins of leg with pain, bilateral 10/03/2019   Diabetes (HCC) 08/29/2019   Essential hypertension 08/29/2019   Venous stasis 08/29/2019   Swelling of limb 08/29/2019   Stage I breast cancer, right (HCC) 02/22/2015   History of breast cancer in female 09/29/2010          Physical Exam   Triage Vital Signs: ED Triage Vitals  Encounter Vitals Group     BP 10/27/22 1146 117/82     Systolic BP Percentile --      Diastolic BP Percentile --      Pulse Rate 10/27/22 1146 75     Resp 10/27/22 1146 18     Temp 10/27/22 1146 98.5 F (36.9 C)     Temp Source 10/27/22 1146 Oral     SpO2 10/27/22 1146 98 %     Weight 10/27/22 1145 176 lb (79.8 kg)     Height 10/27/22 1145 5\' 8"  (1.727 m)     Head Circumference --      Peak Flow --      Pain Score 10/27/22 1144 2     Pain Loc --      Pain Education --      Exclude from Growth Chart --     Most recent vital signs: Vitals:   10/27/22 1638 10/27/22 1718  BP: (!) 161/93 (!) 141/70  Pulse: 67 65  Resp: 18 17  Temp:    SpO2: 98% 98%    Physical Exam Vitals and nursing note reviewed.  Constitutional:      General: Awake and alert. No acute distress.    Appearance: Normal appearance. The patient is  normal weight.  HENT:     Head: Normocephalic and atraumatic.     Mouth: Mucous membranes are moist.  Eyes:     General: PERRL. Normal EOMs        Right eye: No discharge.        Left eye: No discharge.     Conjunctiva/sclera: Conjunctivae normal.  Cardiovascular:     Rate and Rhythm: Normal rate and regular rhythm.     Pulses: Normal pulses.  Pulmonary:     Effort: Pulmonary effort is normal. No respiratory distress.     Breath sounds: Normal breath sounds.  Abdominal:     Abdomen is soft. There is no abdominal tenderness. No rebound or guarding. No distention. Musculoskeletal:        General: No swelling. Normal range of motion.     Cervical back: Normal range of motion and neck supple.  Skin:    General: Skin is warm and dry.     Capillary Refill: Capillary refill takes less than 2 seconds.  Findings: No rash.  Neurological:     Mental Status: The patient is awake and alert.   Neurological: GCS 15 alert and oriented x3 Normal speech, no expressive or receptive aphasia or dysarthria Cranial nerves II through XII intact Normal visual fields 5 out of 5 strength in all 4 extremities with intact sensation throughout No extremity drift Normal finger-to-nose testing, no limb or truncal ataxia    ED Results / Procedures / Treatments   Labs (all labs ordered are listed, but only abnormal results are displayed) Labs Reviewed  CBC - Abnormal; Notable for the following components:      Result Value   RBC 5.15 (*)    MCV 79.8 (*)    MCH 25.2 (*)    All other components within normal limits  BASIC METABOLIC PANEL - Abnormal; Notable for the following components:   Glucose, Bld 312 (*)    GFR, Estimated 57 (*)    All other components within normal limits     EKG     RADIOLOGY I independently reviewed and interpreted imaging and agree with radiologists findings.     PROCEDURES:  Critical Care performed:   Procedures   MEDICATIONS ORDERED IN  ED: Medications  lisinopril (ZESTRIL) tablet 20 mg (20 mg Oral Given 10/27/22 1418)  acetaminophen (TYLENOL) tablet 650 mg (650 mg Oral Given 10/27/22 1424)  cloNIDine (CATAPRES) tablet 0.1 mg (0.1 mg Oral Given 10/27/22 1552)     IMPRESSION / MDM / ASSESSMENT AND PLAN / ED COURSE  I reviewed the triage vital signs and the nursing notes.   Differential diagnosis includes, but is not limited to, hypertension, hypertensive urgency, hypertensive emergency, intracranial hemorrhage.  Patient is awake and alert, neurologically intact and afebrile.  She is nontoxic in appearance.  Further workup is indicated.  Labs obtained in triage are overall reassuring.  She has a nonfocal neurological exam.  I reviewed the patient's chart, which says that she is taking Xarelto, however when I asked her about this she reports that she has not been taking this for a long time.  CT head obtained for evaluation of intracranial hemorrhage or other intracranial abnormality and this was negative.  She was given a dose of her home blood pressure medication with limited improvement of her symptoms and was subsequently given clonidine with improvement of her blood pressure and resolution of her headache.  I recommended close outpatient follow-up with her PCP for blood pressure recheck and management.  I explained in a reluctant to change her dose in the emergency department given the lability of blood pressure and the dangers of lowering her blood pressure too quickly.  Family is in agreement with this plan.  They feel that she is able to follow-up with PCP on Monday.  We discussed return precautions in the meantime.  Patient and family understand agree with plan.  Patient was discharged in stable condition.  Patient was discussed with Dr. Erma Heritage who agrees with assessment and plan.   Patient's presentation is most consistent with acute complicated illness / injury requiring diagnostic workup.   Clinical Course as of  10/27/22 1753  Fri Oct 27, 2022  1546 Patient reports headache improved but not resolved [JP]  1702 Headache has resolved [JP]    Clinical Course User Index [JP] Daniyah Fohl, Herb Grays, PA-C     FINAL CLINICAL IMPRESSION(S) / ED DIAGNOSES   Final diagnoses:  Hypertension, unspecified type  Acute nonintractable headache, unspecified headache type     Rx / DC Orders  ED Discharge Orders     None        Note:  This document was prepared using Dragon voice recognition software and may include unintentional dictation errors.   Jackelyn Hoehn, PA-C 10/27/22 1753    Shaune Pollack, MD 11/03/22 1136

## 2022-10-27 NOTE — ED Triage Notes (Signed)
Pt sts that she was over at [facility] and was sent here due to her having hypertension and a headache.

## 2022-10-27 NOTE — Discharge Instructions (Signed)
Please follow-up with your outpatient provider in the next few days regarding her blood pressure and management.  Please return to the emergency department for any new, worsening, or change in symptoms or new concerns.  It is a pleasure caring for you today.

## 2022-10-27 NOTE — ED Notes (Signed)
B/p  right arm   184/86                Left arm     180/76

## 2022-10-27 NOTE — ED Notes (Signed)
See triage note   Presents with headache  States she is having pain to right side of head  went [facility]  They sent her here d/t elevated b/p  Denies any extremity weakness or slurred

## 2022-11-02 ENCOUNTER — Telehealth (INDEPENDENT_AMBULATORY_CARE_PROVIDER_SITE_OTHER): Payer: Self-pay | Admitting: Nurse Practitioner

## 2022-11-02 NOTE — Telephone Encounter (Signed)
Patient called and left message after hours at [facility] Stating that her blood pressure is up and she has pain again in feet. Wants to see FB. Tried to call patient back 2 times. No answer and mailbox is full.

## 2022-11-03 ENCOUNTER — Ambulatory Visit (INDEPENDENT_AMBULATORY_CARE_PROVIDER_SITE_OTHER): Payer: Medicare Other | Admitting: Vascular Surgery

## 2022-11-23 ENCOUNTER — Encounter (INDEPENDENT_AMBULATORY_CARE_PROVIDER_SITE_OTHER): Payer: Self-pay | Admitting: Nurse Practitioner

## 2022-11-23 ENCOUNTER — Ambulatory Visit (INDEPENDENT_AMBULATORY_CARE_PROVIDER_SITE_OTHER): Payer: Medicare Other | Admitting: Nurse Practitioner

## 2022-11-23 VITALS — BP 153/80 | HR 70 | Resp 16 | Wt 174.8 lb

## 2022-11-23 DIAGNOSIS — I83813 Varicose veins of bilateral lower extremities with pain: Secondary | ICD-10-CM

## 2022-11-23 DIAGNOSIS — E119 Type 2 diabetes mellitus without complications: Secondary | ICD-10-CM | POA: Diagnosis not present

## 2022-11-23 DIAGNOSIS — R202 Paresthesia of skin: Secondary | ICD-10-CM | POA: Diagnosis not present

## 2022-11-23 DIAGNOSIS — R200 Anesthesia of skin: Secondary | ICD-10-CM

## 2022-11-23 MED ORDER — GABAPENTIN 100 MG PO CAPS: 100.0000 mg | ORAL_CAPSULE | Freq: Every day | ORAL | 0 refills | Status: AC

## 2022-11-25 ENCOUNTER — Encounter (INDEPENDENT_AMBULATORY_CARE_PROVIDER_SITE_OTHER): Payer: Self-pay | Admitting: Nurse Practitioner

## 2022-11-25 NOTE — Progress Notes (Signed)
Subjective:    Patient ID: Rebekah Lowery, female    DOB: 1933/03/31, 89 y.o.   MRN: [id number] Chief Complaint  Patient presents with   Follow-up    Bilateral feet pain    The patient presents today for evaluation of burning and stinging on the top of her feet.  The patient notes that she was advised by her PCP as this may be related to her varicose veins.  Burning and stinging comes and goes and is not always consistent.  There are currently no open wounds or ulcerations.  There is currently no  swelling present.    Review of Systems  Neurological:        Burning sensation  All other systems reviewed and are negative.      Objective:   Physical Exam Vitals reviewed.  HENT:     Head: Normocephalic.  Cardiovascular:     Rate and Rhythm: Normal rate.  Pulmonary:     Effort: Pulmonary effort is normal.  Skin:    General: Skin is warm and dry.  Neurological:     Mental Status: She is alert and oriented to person, place, and time.  Psychiatric:        Mood and Affect: Mood normal.        Behavior: Behavior normal.        Thought Content: Thought content normal.        Judgment: Judgment normal.     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   MVP (mitral valve prolapse)    PAD (peripheral artery disease) (HCC)    Stroke (HCC) 04/15/09    Social History   Socioeconomic History   Marital status: Divorced    Spouse name: Not on file   Number of children: Not on file   Years of education: Not on file   Highest education level: Not on  file  Occupational History   Not on file  Tobacco Use   Smoking status: Never   Smokeless tobacco: Never  Vaping Use   Vaping status: Never Used  Substance and Sexual Activity   Alcohol use: No    Alcohol/week: 0.0 standard drinks of alcohol   Drug use: No   Sexual activity: Not Currently  Other Topics Concern   Not on file  Social History Narrative   Not on file   Social Determinants of Health   Financial Resource Strain: Not on file  Food Insecurity: Not on file  Transportation Needs: Not on file  Physical Activity: Not on file  Stress: Not on file  Social Connections: Not on file  Intimate Partner Violence: Not on file    Past Surgical History:  Procedure Laterality Date   ABDOMINAL HYSTERECTOMY     BREAST SURGERY  01/29/1990   mastectomy of left   BREAST SURGERY  09/12/2005   partial mastectomy of right   BREAST SURGERY  2013   Right Mastectomy  CATARACT EXTRACTION W/PHACO Right 08/03/2014   Procedure: CATARACT EXTRACTION PHACO AND INTRAOCULAR LENS PLACEMENT (IOC);  Surgeon: Sallee Lange, MD;  Location: [facility];  Service: Ophthalmology;  Laterality: Right;  [place]: 01:17 AP%: 24.0 CDE: 32.46  Fluid Lot# [id number]    COLONOSCOPY  2010   EYE SURGERY     LAPAROSCOPIC HYSTERECTOMY  1982   MASTECTOMY     BIL   TONSILLECTOMY     TOTAL HIP ARTHROPLASTY Left 10/18/2020   Procedure: TOTAL HIP ARTHROPLASTY ANTERIOR APPROACH;  Surgeon: Lyndle Herrlich, MD;  Location: [facility];  Service: Orthopedics;  Laterality: Left;   Venacure for Vericose vein  03/2020   WRIST SURGERY      Family History  Problem Relation Age of Onset   Cancer Sister        breast   Cancer Maternal Aunt        breast    Allergies  Allergen Reactions   Strawberry Leaves Extract Hives    Facial and body swelling    Ibuprofen Other (See Comments)    Gi- upset   Other     Cheese causes hypertension and constipation   Aspirin Swelling and Other (See Comments)    Swelling of lips,  caused pain also.   Citric Acid Swelling and Rash    Swelling all over the body.   Codeine Itching, Swelling and Rash    Swelling was all over the body.   Lemon Juice Swelling and Rash    Allergic to lemons in general.   Lemon Oil Rash and Swelling    Allergic to lemons in general.   Monosodium Glutamate Other (See Comments)    Causes spike in bp, nausea, constipation, gi upset.   Peanut-Containing Drug Products Nausea Only    Gi- upset       Latest Ref Rng & Units 10/27/2022   11:46 AM 10/20/2020    3:12 AM 10/19/2020    5:04 AM  CBC  WBC 4.0 - 10.5 K/uL 8.0  8.7  9.0   Hemoglobin 12.0 - 15.0 g/dL 45.4  09.8  11.9   Hematocrit 36.0 - 46.0 % 41.1  29.5  31.8   Platelets 150 - 400 K/uL 277  216  225       CMP     Component Value Date/Time   NA 135 10/27/2022 1146   NA 139 04/19/2014 2354   K 4.0 10/27/2022 1146   K 4.0 04/19/2014 2354   CL 99 10/27/2022 1146   CL 106 04/19/2014 2354   CO2 26 10/27/2022 1146   CO2 28 04/19/2014 2354   GLUCOSE 312 (H) 10/27/2022 1146   GLUCOSE 146 (H) 04/19/2014 2354   BUN 20 10/27/2022 1146   BUN 19 04/19/2014 2354   CREATININE 0.96 10/27/2022 1146   CREATININE 1.07 (H) 04/19/2014 2354   CALCIUM 9.0 10/27/2022 1146   CALCIUM 8.7 (L) 04/19/2014 2354   PROT 7.6 03/30/2017 1916   PROT 8.0 04/19/2014 2354   ALBUMIN 3.9 03/30/2017 1916   ALBUMIN 4.0 04/19/2014 2354   AST 17 03/30/2017 1916   AST 17 04/19/2014 2354   ALT 16 03/30/2017 1916   ALT 14 04/19/2014 2354   ALKPHOS 65 03/30/2017 1916   ALKPHOS 48 04/19/2014 2354   BILITOT 0.6 03/30/2017 1916   BILITOT 0.3 04/19/2014 2354   GFRNONAA 57 (L) 10/27/2022 1146   GFRNONAA 49 (L) 04/19/2014 2354     No results found.     Assessment & Plan:  1. Numbness and tingling I suspect that the burning and stinging sensation she has is truly related to neuropathy.  She was previously sent a referral to the neurologist but I do not think this was ever scheduled.  We will try a  very low-dose of gabapentin to see if this improves her symptoms.  If it is we will defer continued management to her PCP.  Will have her return in a month  2. Type 2 diabetes mellitus without complication, without long-term current use of insulin (HCC) Continue hypoglycemic medications as already ordered, these medications have been reviewed and there are no changes at this time.  Hgb A1C to be monitored as already arranged by primary service  3. Varicose veins of leg with pain, bilateral Varicosities can irritate patient's with neuropathy and we discussed possible repeat of sclerotherapy but the patient was not interested in moving forward at this time.   Current Outpatient Medications on File Prior to Visit  Medication Sig Dispense Refill   acetaminophen (TYLENOL) 500 MG tablet Take 500 mg by mouth every 6 (six) hours as needed for moderate pain or mild pain.     Alcohol Swabs ([facility] PREP) 70 % PADS Apply 1 each topically 2 (two) times daily.     amLODipine (NORVASC) 2.5 MG tablet Take 2.5 mg by mouth 2 (two) times daily.     Blood Glucose Calibration (ONETOUCH VERIO) SOLN USE AS DIRECTED WITH GLUCOMETER     cyanocobalamin 1000 MCG tablet Take 1,000 mcg by mouth daily.     docusate sodium (COLACE) 100 MG capsule Take 1 capsule (100 mg total) by mouth 2 (two) times daily. 30 capsule 0   glipiZIDE (GLUCOTROL XL) 5 MG 24 hr tablet Take 5 mg by mouth daily with breakfast.     JARDIANCE 25 MG TABS tablet Take 25 mg by mouth daily.     Lancets (ONETOUCH DELICA PLUS LANCET30G) MISC SMARTSIG:Via Meter     lisinopril (ZESTRIL) 10 MG tablet Take 10 mg by mouth daily.  1   ONETOUCH VERIO test strip SMARTSIG:Strip(s) Via Meter Twice Daily     rivaroxaban (XARELTO) 10 MG TABS tablet Take 1 tablet (10 mg total) by mouth daily. (Patient not taking: Reported on 08/01/2022) 10 tablet 0   No current facility-administered medications on file prior to visit.    There are no Patient Instructions on file for  this visit. No follow-ups on file.   Georgiana Spinner, NP

## 2023-01-01 ENCOUNTER — Ambulatory Visit (INDEPENDENT_AMBULATORY_CARE_PROVIDER_SITE_OTHER): Payer: Medicare Other | Admitting: Nurse Practitioner

## 2023-01-08 ENCOUNTER — Other Ambulatory Visit: Payer: Self-pay | Admitting: Nurse Practitioner

## 2023-01-08 DIAGNOSIS — R1111 Vomiting without nausea: Secondary | ICD-10-CM

## 2023-01-08 DIAGNOSIS — R140 Abdominal distension (gaseous): Secondary | ICD-10-CM

## 2023-04-02 LAB — HM DIABETES EYE EXAM

## 2023-04-04 ENCOUNTER — Ambulatory Visit
Admission: RE | Admit: 2023-04-04 | Discharge: 2023-04-04 | Disposition: A | Discharge status: Home / Self Care | Source: Ambulatory Visit | Attending: Nurse Practitioner | Admitting: Nurse Practitioner

## 2023-04-04 DIAGNOSIS — R140 Abdominal distension (gaseous): Secondary | ICD-10-CM

## 2023-04-04 DIAGNOSIS — R1111 Vomiting without nausea: Secondary | ICD-10-CM | POA: Diagnosis present

## 2023-04-04 LAB — POCT I-STAT CREATININE: Creatinine, Ser: 1.1 mg/dL — ABNORMAL HIGH (ref 0.44–1.00)

## 2023-04-04 MED ORDER — IOHEXOL 300 MG/ML  SOLN
100.0000 mL | Freq: Once | INTRAMUSCULAR | Status: AC | PRN
  Administered 2023-04-04: 100 mL via INTRAVENOUS

## 2023-05-22 ENCOUNTER — Encounter (INDEPENDENT_AMBULATORY_CARE_PROVIDER_SITE_OTHER): Payer: Self-pay

## 2023-06-21 LAB — HEMOGLOBIN A1C: Hemoglobin A1C: 12.9

## 2023-06-22 NOTE — Progress Notes (Signed)
 Patient came to mobile screening at [facility]. Blood pressure elevated 170/98>153/84. A1C 12.9. It was recommended that patient seek  care with PCP ASAP. We discussed living a active healthy lifestyle. NO SDOH.

## 2023-08-01 ENCOUNTER — Ambulatory Visit (INDEPENDENT_AMBULATORY_CARE_PROVIDER_SITE_OTHER): Payer: Medicare Other | Admitting: Nurse Practitioner

## 2023-08-09 ENCOUNTER — Ambulatory Visit (INDEPENDENT_AMBULATORY_CARE_PROVIDER_SITE_OTHER): Admitting: Nurse Practitioner

## 2023-08-29 ENCOUNTER — Encounter: Attending: Nurse Practitioner | Admitting: Dietician

## 2023-08-29 ENCOUNTER — Encounter: Payer: Self-pay | Admitting: Dietician

## 2023-08-29 VITALS — Ht 68.0 in | Wt 176.5 lb

## 2023-08-29 DIAGNOSIS — Z713 Dietary counseling and surveillance: Secondary | ICD-10-CM | POA: Insufficient documentation

## 2023-08-29 DIAGNOSIS — E663 Overweight: Secondary | ICD-10-CM | POA: Diagnosis not present

## 2023-08-29 DIAGNOSIS — E1140 Type 2 diabetes mellitus with diabetic neuropathy, unspecified: Secondary | ICD-10-CM

## 2023-08-29 DIAGNOSIS — E119 Type 2 diabetes mellitus without complications: Secondary | ICD-10-CM | POA: Insufficient documentation

## 2023-08-29 DIAGNOSIS — Z6826 Body mass index (BMI) 26.0-26.9, adult: Secondary | ICD-10-CM | POA: Diagnosis not present

## 2023-08-29 NOTE — Progress Notes (Addendum)
 Diabetes Self-Management Education  Visit Type: First/Initial  Appt. Start Time: 0955 Appt. End Time: 1100  08/29/2023  Ms. Rebekah Lowery, identified by name and date of birth, is a 89 y.o. female with a diagnosis of Diabetes: Type 2.   ASSESSMENT  Height 5' 8 (1.727 m), weight 176 lb 8 oz (80.1 kg). Body mass index is 26.84 kg/m.   Diabetes Self-Management Education - 08/29/23 1012       Visit Information   Visit Type First/Initial      Initial Visit   Diabetes Type Type 2    Are you currently following a meal plan? No    Are you taking your medications as prescribed? No      Health Coping   How would you rate your overall health? Good      Psychosocial Assessment   Patient Belief/Attitude about Diabetes Motivated to manage diabetes    What is the hardest part about your diabetes right now, causing you the most concern, or is the most worrisome to you about your diabetes?   Making healty food and beverage choices;Other (comment)   eating late at night   Self-care barriers None    Learning Readiness Ready    How often do you need to have someone help you when you read instructions, pamphlets, or other written materials from your doctor or pharmacy? 1 - Never    What is the last grade level you completed in school? some college      Pre-Education Assessment   Patient understands the diabetes disease and treatment process. Needs Review    Patient understands incorporating nutritional management into lifestyle. Needs Review    Patient undertands incorporating physical activity into lifestyle. Needs Review    Patient understands using medications safely. Needs Review    Patient understands monitoring blood glucose, interpreting and using results Needs Review    Patient understands prevention, detection, and treatment of acute complications. Needs Review    Patient understands prevention, detection, and treatment of chronic complications. Needs Review    Patient understands  how to develop strategies to address psychosocial issues. Needs Review    Patient understands how to develop strategies to promote health/change behavior. Needs Review      Complications   Last HgB A1C per patient/outside source 12.9 %   06/2023   How often do you check your blood sugar? 3-4 times / week    Fasting Blood glucose range (mg/dL) 819-799;869-820   869-799   Number of hypoglycemic episodes per month --   reports headache when waiting too long to eat   Have you had a dilated eye exam in the past 12 months? Yes    Have you had a dental exam in the past 12 months? Yes    Are you checking your feet? Yes    How many days per week are you checking your feet? 7   sees podiatrist     Dietary Intake   Breakfast scrambled egg +/or old fashioned oatmeal with nuts, cinnamon, occ coffee    Lunch sandwiches; chicken/ turkey/ fish; tacos with sm amt beef + tomato, lettuce; occ reuben; out maybe once a week    Snack (afternoon) yogurt; crackers occ with peanut butter    Dinner salad    Snack (evening) cucumbers; nuts ie walnuts, sunflower seeds; crackers unsalted whole wheat    Beverage(s) mostly water; tea with ginger, garlic, celery, mint; soda ie Dr Nunzio or Root beer if out      Activity /  Exercise   Activity / Exercise Type ADL's      Patient Education   Previous Diabetes Education Yes   after diagnosis about 10 years ago   Disease Pathophysiology Explored patient's options for treatment of their diabetes    Healthy Eating Role of diet in the treatment of diabetes and the relationship between the three main macronutrients and blood glucose level;Plate Method;Meal timing in regards to the patients' current diabetes medication.;Meal options for control of blood glucose level and chronic complications.;Other (comment)   patient's food allergies/ intolerances   Being Active Role of exercise on diabetes management, blood pressure control and cardiac health.;Helped patient identify appropriate  exercises in relation to his/her diabetes, diabetes complications and other health issue.    Monitoring Purpose and frequency of SMBG.;Taught/discussed recording of test results and interpretation of SMBG.;Identified appropriate SMBG and/or A1C goals.    Acute complications Taught prevention, symptoms, and  treatment of hypoglycemia - the 15 rule.    Chronic complications Relationship between chronic complications and blood glucose control    Diabetes Stress and Support Role of stress on diabetes      Individualized Goals (developed by patient)   Nutrition Follow meal plan discussed    Physical Activity Exercise 3-5 times per week    Medications Other (comment)   revisit medication needs with physician/ PCP   Monitoring  Test my blood glucose as discussed    Problem Solving Sleep Pattern      Post-Education Assessment   Patient understands the diabetes disease and treatment process. Needs Review    Patient understands incorporating nutritional management into lifestyle. Comprehends key points    Patient undertands incorporating physical activity into lifestyle. Comprehends key points    Patient understands using medications safely. Needs Review    Patient understands monitoring blood glucose, interpreting and using results Comprehends key points    Patient understands prevention, detection, and treatment of acute complications. Comprehends key points    Patient understands prevention, detection, and treatment of chronic complications. Needs Review    Patient understands how to develop strategies to address psychosocial issues. Needs Review    Patient understands how to develop strategies to promote health/change behavior. Needs Review      Outcomes   Expected Outcomes Demonstrated interest in learning. Expect positive outcomes    Future DMSE PRN          Individualized Plan for Diabetes Self-Management Training:   Learning Objective:  Patient will have a greater understanding of  diabetes self-management. Patient education plan is to attend individual and/or group sessions per assessed needs and concerns.   Plan:   Patient Instructions  Include some light exercise 3 or more days a week -- start with a short time and gradually increase as able.  Work on going to sleep earlier at night; shut off computer sooner. This might help improve blood sugar in the mornings Continue making healthy food choices and controlling portions, great job!  Expected Outcomes:  Demonstrated interest in learning. Expect positive outcomes  Education material provided: Plate Planner with food lists; sample menus; glucose recording sheet, visit summary with goals/ instructions  If problems or questions, patient to contact team via:  Phone  Future DSME appointment: PRN

## 2023-08-29 NOTE — Patient Instructions (Signed)
 Include some light exercise 3 or more days a week -- start with a short time and gradually increase as able.  Work on going to sleep earlier at night; shut off computer sooner. This might help improve blood sugar in the mornings Continue making healthy food choices and controlling portions, great job!

## 2023-09-13 ENCOUNTER — Ambulatory Visit (INDEPENDENT_AMBULATORY_CARE_PROVIDER_SITE_OTHER): Admitting: Nurse Practitioner

## 2023-09-13 VITALS — BP 142/83 | HR 74 | Wt 172.5 lb

## 2023-09-13 DIAGNOSIS — M7989 Other specified soft tissue disorders: Secondary | ICD-10-CM

## 2023-09-13 DIAGNOSIS — E119 Type 2 diabetes mellitus without complications: Secondary | ICD-10-CM

## 2023-09-13 DIAGNOSIS — I10 Essential (primary) hypertension: Secondary | ICD-10-CM | POA: Diagnosis not present

## 2023-09-17 ENCOUNTER — Encounter (INDEPENDENT_AMBULATORY_CARE_PROVIDER_SITE_OTHER): Payer: Self-pay | Admitting: Nurse Practitioner

## 2023-09-17 NOTE — Progress Notes (Signed)
 Subjective:    Patient ID: Rebekah Lowery, female    DOB: 1933-01-27, 89 y.o.   MRN: [id number] No chief complaint on file.   Patient returns today for follow-up evaluation of lower extremity edema and varicose veins.  The patient has sclerotherapy performed previously.  Rebekah Lowery notes that her legs are doing well without any significant issues.  Rebekah Lowery is currently engaging with conservative therapy with use of medical grade compression, elevation and activity and notes that overall her legs seem to be doing well.    Review of Systems  All other systems reviewed and are negative.      Objective:   Physical Exam Vitals reviewed.  HENT:     Head: Normocephalic.  Cardiovascular:     Rate and Rhythm: Normal rate.     Pulses: Normal pulses.  Pulmonary:     Effort: Pulmonary effort is normal.  Skin:    General: Skin is warm and dry.  Neurological:     Mental Status: Rebekah Lowery is alert and oriented to person, place, and time.  Psychiatric:        Mood and Affect: Mood normal.        Behavior: Behavior normal.        Thought Content: Thought content normal.        Judgment: Judgment normal.     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   MVP (mitral valve prolapse)    PAD (peripheral artery disease) (HCC)    Stroke (HCC) 04/15/09    Social History   Socioeconomic History   Marital status: Divorced    Spouse name: Not on file   Number of children: Not on file   Years of education: Not on file   Highest education level: Not on file  Occupational History   Not on file  Tobacco Use    Smoking status: Never   Smokeless tobacco: Never  Vaping Use   Vaping status: Never Used  Substance and Sexual Activity   Alcohol use: No    Alcohol/week: 0.0 standard drinks of alcohol   Drug use: No   Sexual activity: Not Currently  Other Topics Concern   Not on file  Social History Narrative   Not on file   Social Drivers of Health   Financial Resource Strain: Not on file  Food Insecurity: Patient Declined (06/22/2023)   Hunger Vital Sign    Worried About Running Out of Food in the Last Year: Patient declined    Ran Out of Food in the Last Year: Patient declined  Transportation Needs: Patient Declined (06/22/2023)   PRAPARE - Administrator, Civil Service (Medical): Patient declined    Lack of Transportation (Non-Medical): Patient declined  Physical Activity: Not on file  Stress: Not on file  Social Connections: Not on file  Intimate Partner Violence: Patient Declined (06/22/2023)   Humiliation, Afraid, Rape, and Kick questionnaire    Fear of Current or Ex-Partner: Patient declined  Emotionally Abused: Patient declined    Physically Abused: Patient declined    Sexually Abused: Patient declined    Past Surgical History:  Procedure Laterality Date   ABDOMINAL HYSTERECTOMY     BREAST SURGERY  01/29/1990   mastectomy of left   BREAST SURGERY  09/12/2005   partial mastectomy of right   BREAST SURGERY  2013   Right Mastectomy   CATARACT EXTRACTION W/PHACO Right 08/03/2014   Procedure: CATARACT EXTRACTION PHACO AND INTRAOCULAR LENS PLACEMENT (IOC);  Surgeon: Steven Dingeldein, MD;  Location: [facility];  Service: Ophthalmology;  Laterality: Right;  US : 01:17 AP%: 24.0 CDE: 32.46  Fluid Lot# [id number]    COLONOSCOPY  2010   EYE SURGERY     LAPAROSCOPIC HYSTERECTOMY  1982   MASTECTOMY     BIL   TONSILLECTOMY     TOTAL HIP ARTHROPLASTY Left 10/18/2020   Procedure: TOTAL HIP ARTHROPLASTY ANTERIOR APPROACH;  Surgeon: Leora Lynwood SAUNDERS, MD;  Location: [facility];   Service: Orthopedics;  Laterality: Left;   Venacure for Vericose vein  03/2020   WRIST SURGERY      Family History  Problem Relation Age of Onset   Cancer Sister        breast   Cancer Maternal Aunt        breast    Allergies  Allergen Reactions   Strawberry Leaves Extract Hives    Facial and body swelling    Beef (Bovine) Protein Other (See Comments)   Ibuprofen Other (See Comments)    Gi- upset   Other     Cheese causes hypertension and constipation   Pork Allergy Other (See Comments)   Aspirin Swelling and Other (See Comments)    Swelling of lips, caused pain also.   Citric Acid Swelling and Rash    Swelling all over the body.   Codeine Itching, Swelling and Rash    Swelling was all over the body.   Lemon Juice Swelling and Rash    Allergic to lemons in general.   Lemon Oil Rash and Swelling    Allergic to lemons in general.   Monosodium Glutamate Other (See Comments)    Causes spike in bp, nausea, constipation, gi upset.   Peanut-Containing Drug Products Nausea Only    Gi- upset       Latest Ref Rng & Units 10/27/2022   11:46 AM 10/20/2020    3:12 AM 10/19/2020    5:04 AM  CBC  WBC 4.0 - 10.5 K/uL 8.0  8.7  9.0   Hemoglobin 12.0 - 15.0 g/dL 86.9  89.9  89.3   Hematocrit 36.0 - 46.0 % 41.1  29.5  31.8   Platelets 150 - 400 K/uL 277  216  225       CMP     Component Value Date/Time   NA 135 10/27/2022 1146   NA 139 04/19/2014 2354   K 4.0 10/27/2022 1146   K 4.0 04/19/2014 2354   CL 99 10/27/2022 1146   CL 106 04/19/2014 2354   CO2 26 10/27/2022 1146   CO2 28 04/19/2014 2354   GLUCOSE 312 (H) 10/27/2022 1146   GLUCOSE 146 (H) 04/19/2014 2354   BUN 20 10/27/2022 1146   BUN 19 04/19/2014 2354   CREATININE 1.10 (H) 04/04/2023 1409   CREATININE 1.07 (H) 04/19/2014 2354   CALCIUM 9.0 10/27/2022 1146   CALCIUM 8.7 (L) 04/19/2014 2354   PROT 7.6 03/30/2017 1916   PROT 8.0 04/19/2014 2354   ALBUMIN 3.9 03/30/2017  1916   ALBUMIN 4.0 04/19/2014 2354    AST 17 03/30/2017 1916   AST 17 04/19/2014 2354   ALT 16 03/30/2017 1916   ALT 14 04/19/2014 2354   ALKPHOS 65 03/30/2017 1916   ALKPHOS 48 04/19/2014 2354   BILITOT 0.6 03/30/2017 1916   BILITOT 0.3 04/19/2014 2354   GFRNONAA 57 (L) 10/27/2022 1146   GFRNONAA 49 (L) 04/19/2014 2354     No results found.     Assessment & Plan:   1. Swelling of limb (Primary) Rebekah Lowery reports that her legs are doing well without any significant edema.  This is with her use of medical grade compression stockings and conservative therapy.  Overall Rebekah Lowery is doing very well and notes that Rebekah Lowery will contact us  if Rebekah Lowery begins to have any significant issues.  Patient will follow-up on an as-needed basis.  2. Type 2 diabetes mellitus without complication, without long-term current use of insulin (HCC) Continue hypoglycemic medications as already ordered, these medications have been reviewed and there are no changes at this time.  Hgb A1C to be monitored as already arranged by primary service  3. Essential hypertension Continue antihypertensive medications as already ordered, these medications have been reviewed and there are no changes at this time.   Current Outpatient Medications on File Prior to Visit  Medication Sig Dispense Refill   acetaminophen  (TYLENOL ) 500 MG tablet Take 500 mg by mouth every 6 (six) hours as needed for moderate pain or mild pain.     Alcohol Swabs  ([employer] PREP) 70 % PADS Apply 1 each topically 2 (two) times daily.     amLODipine (NORVASC) 2.5 MG tablet Take 2.5 mg by mouth 2 (two) times daily. (Patient not taking: Reported on 09/13/2023)     Blood Glucose Calibration (ONETOUCH VERIO) SOLN USE AS DIRECTED WITH GLUCOMETER     cyanocobalamin 1000 MCG tablet Take 1,000 mcg by mouth daily.     docusate sodium  (COLACE) 100 MG capsule Take 1 capsule (100 mg total) by mouth 2 (two) times daily. 30 capsule 0   gabapentin  (NEURONTIN ) 100 MG capsule Take 1 capsule (100 mg total) by mouth at  bedtime. 30 capsule 0   glipiZIDE  (GLUCOTROL  XL) 5 MG 24 hr tablet Take 5 mg by mouth daily with breakfast. (Patient not taking: Reported on 09/13/2023)     Iron-Vitamins (GERITOL PO) Take by mouth.     JARDIANCE 25 MG TABS tablet Take 25 mg by mouth daily. (Patient not taking: Reported on 09/13/2023)     Lancets (ONETOUCH DELICA PLUS LANCET30G) MISC SMARTSIG:Via Meter     lisinopril  (ZESTRIL ) 10 MG tablet Take 10 mg by mouth daily. (Patient not taking: Reported on 09/13/2023)  1   ONETOUCH VERIO test strip SMARTSIG:Strip(s) Via Meter Twice Daily     rivaroxaban  (XARELTO ) 10 MG TABS tablet Take 1 tablet (10 mg total) by mouth daily. (Patient not taking: Reported on 09/13/2023) 10 tablet 0   No current facility-administered medications on file prior to visit.    There are no Patient Instructions on file for this visit. No follow-ups on file.   Mark Hassey E Talina Pleitez, NP

## 2024-01-07 ENCOUNTER — Other Ambulatory Visit: Payer: Self-pay

## 2024-01-07 ENCOUNTER — Emergency Department

## 2024-01-07 ENCOUNTER — Emergency Department
Admission: EM | Admit: 2024-01-07 | Discharge: 2024-01-07 | Disposition: A | Discharge status: Home / Self Care | Attending: Emergency Medicine | Admitting: Emergency Medicine

## 2024-01-07 DIAGNOSIS — M62830 Muscle spasm of back: Secondary | ICD-10-CM | POA: Diagnosis not present

## 2024-01-07 DIAGNOSIS — M542 Cervicalgia: Secondary | ICD-10-CM | POA: Insufficient documentation

## 2024-01-07 DIAGNOSIS — M62838 Other muscle spasm: Secondary | ICD-10-CM | POA: Insufficient documentation

## 2024-01-07 DIAGNOSIS — R519 Headache, unspecified: Secondary | ICD-10-CM | POA: Diagnosis not present

## 2024-01-07 DIAGNOSIS — I10 Essential (primary) hypertension: Secondary | ICD-10-CM | POA: Diagnosis not present

## 2024-01-07 DIAGNOSIS — E119 Type 2 diabetes mellitus without complications: Secondary | ICD-10-CM | POA: Insufficient documentation

## 2024-01-07 MED ORDER — CYCLOBENZAPRINE HCL 10 MG PO TABS
5.0000 mg | ORAL_TABLET | Freq: Once | ORAL | Status: AC
  Administered 2024-01-07: 5 mg via ORAL
  Filled 2024-01-07: qty 1

## 2024-01-07 MED ORDER — CYCLOBENZAPRINE HCL 5 MG PO TABS: 5.0000 mg | ORAL_TABLET | Freq: Every day | ORAL | 0 refills | Status: AC

## 2024-01-07 NOTE — Progress Notes (Signed)
 Pr presents with headache and neck pain x 2wks

## 2024-01-07 NOTE — Discharge Instructions (Signed)
 Your exam and CT scan are normal and reassuring at this time.  No signs of any abnormalities of the skull, and only evidence of some mild arthritis in the cervical spine.  Symptoms likely represent muscle strain and spasm.  Take the prescription muscle laxer at night as needed.  Take OTC Tylenol  during the day for additional pain relief.  Follow-up with your primary provider for ongoing evaluation.

## 2024-01-07 NOTE — ED Provider Notes (Signed)
 "   [facility] Emergency Department Provider Note     Event Date/Time   First MD Initiated Contact with Patient 01/07/24 1705     (approximate)   History   Neck Pain   HPI  Rebekah Lowery is a 89 y.o. female with a history of HTN, DM type II, PAD, and anemia, presents to the ED with 2 weeks of intermittent right-sided neck pain.  She reports pain localized to the posterior trapezius musculature that is aggravated by rotation of the neck.  She denies any frank headache related to the neck pain.  No distal paresthesias or weakness is reported.  Patient denies any vision change, tinnitus, vertigo, or edema.  No frank chest pain is reported.  Patient denies any recent injury, trauma, or falls.  She reports symptoms are improved with taking Tylenol .  She denies any current pain at time of this evaluation.   Physical Exam   Triage Vital Signs: ED Triage Vitals [01/07/24 1554]  Encounter Vitals Group     BP (!) 184/94     Girls Systolic BP Percentile      Girls Diastolic BP Percentile      Boys Systolic BP Percentile      Boys Diastolic BP Percentile      Pulse Rate 73     Resp 18     Temp 98 F (36.7 C)     Temp src      SpO2 100 %     Weight 175 lb (79.4 kg)     Height 5' 8 (1.727 m)     Head Circumference      Peak Flow      Pain Score 6     Pain Loc      Pain Education      Exclude from Growth Chart     Most recent vital signs: Vitals:   01/07/24 1554 01/07/24 1951  BP: (!) 184/94 (!) 185/87  Pulse: 73 67  Resp: 18 16  Temp: 98 F (36.7 C)   SpO2: 100% 99%    General Awake, no distress. NAD HEENT NCAT. PERRL. EOMI. No rhinorrhea. Mucous membranes are moist.  CV:  Good peripheral perfusion. RRR RESP:  Normal effort. CTA SKIN:  No skin changes or rash noted MSK:  Normal spinal alignment without midline tenderness, spasm, deformity, or step-off.  Active range of all extremities.  Neck is supple with no crepitus.  Patient with reported  pain to the right trapezius musculature, but no rigidity is appreciated. NEURO: Cranial nerves II to XII grossly intact.   ED Results / Procedures / Treatments   Labs (all labs ordered are listed, but only abnormal results are displayed) Labs Reviewed - No data to display   EKG  RADIOLOGY  I personally viewed and evaluated these images as part of my medical decision making, as well as reviewing the written report by the radiologist.  ED Provider Interpretation: No acute CT findings  CT Head Wo Contrast Result Date: 01/07/2024 EXAM: CT HEAD AND CERVICAL SPINE 01/07/2024 05:51:05 PM TECHNIQUE: CT of the head and cervical spine was performed without the administration of intravenous contrast. Multiplanar reformatted images are provided for review. Automated exposure control, iterative reconstruction, and/or weight based adjustment of the mA/kV was utilized to reduce the radiation dose to as low as reasonably achievable. COMPARISON: None available. CLINICAL HISTORY: pain pain FINDINGS: CT HEAD BRAIN AND VENTRICLES: No acute intracranial hemorrhage. No mass effect or midline shift. No abnormal extra-axial  fluid collection. No evidence of acute infarct. No hydrocephalus. ORBITS: No acute abnormality. SINUSES AND MASTOIDS: No acute abnormality. SOFT TISSUES AND SKULL: No acute skull fracture. No acute soft tissue abnormality. CT CERVICAL SPINE BONES AND ALIGNMENT: No acute fracture or traumatic malalignment. DEGENERATIVE CHANGES: Mild for age degenerative change. SOFT TISSUES: No prevertebral soft tissue swelling. IMPRESSION: 1. No acute intracranial abnormality. 2. No acute fracture or traumatic malalignment of the cervical spine. Electronically signed by: Gilmore Molt 01/07/2024 06:29 PM EST RP Workstation: [id number]   CT Cervical Spine Wo Contrast Result Date: 01/07/2024 EXAM: CT HEAD AND CERVICAL SPINE 01/07/2024 05:51:05 PM TECHNIQUE: CT of the head and cervical spine was performed without the  administration of intravenous contrast. Multiplanar reformatted images are provided for review. Automated exposure control, iterative reconstruction, and/or weight based adjustment of the mA/kV was utilized to reduce the radiation dose to as low as reasonably achievable. COMPARISON: None available. CLINICAL HISTORY: pain pain FINDINGS: CT HEAD BRAIN AND VENTRICLES: No acute intracranial hemorrhage. No mass effect or midline shift. No abnormal extra-axial fluid collection. No evidence of acute infarct. No hydrocephalus. ORBITS: No acute abnormality. SINUSES AND MASTOIDS: No acute abnormality. SOFT TISSUES AND SKULL: No acute skull fracture. No acute soft tissue abnormality. CT CERVICAL SPINE BONES AND ALIGNMENT: No acute fracture or traumatic malalignment. DEGENERATIVE CHANGES: Mild for age degenerative change. SOFT TISSUES: No prevertebral soft tissue swelling. IMPRESSION: 1. No acute intracranial abnormality. 2. No acute fracture or traumatic malalignment of the cervical spine. Electronically signed by: Gilmore Molt 01/07/2024 06:29 PM EST RP Workstation: [id number]    PROCEDURES:  Critical Care performed: No  Procedures   MEDICATIONS ORDERED IN ED: Medications  cyclobenzaprine  (FLEXERIL ) tablet 5 mg (5 mg Oral Given 01/07/24 2005)     IMPRESSION / MDM / ASSESSMENT AND PLAN / ED COURSE  I reviewed the triage vital signs and the nursing notes.                              Differential diagnosis includes, but is not limited to, myalgias, torticollis, radiculopathy, occipital nerve irritation, neuralgia  Patient's presentation is most consistent with acute complicated illness / injury requiring diagnostic workup.  Patient's diagnosis is consistent with the neck pain and muscle spasm to trapezius region.  Geriatric patient with reassuring exam and workup at this time.  No red flags on exam.  She is overall reassuring and does not endorse any acute pain at the time of his presentation.  CT  image interpreted by me, showed no acute intracranial process, and cervical CT shows only some mild multilevel degenerative changes.  Low concern for radiculopathy as patient symptoms are reproducible with palpation along the posterior trapezius musculature.  Patient will be discharged home with prescriptions for cyclobenzaprine . Patient is to follow up with her primary provider as suggested, as needed or otherwise directed. Patient is given ED precautions to return to the ED for any worsening or new symptoms.   FINAL CLINICAL IMPRESSION(S) / ED DIAGNOSES   Final diagnoses:  Neck pain  Trapezius muscle spasm     Rx / DC Orders   ED Discharge Orders          Ordered    cyclobenzaprine  (FLEXERIL ) 5 MG tablet  Daily at bedtime        01/07/24 1957             Note:  This document was prepared using Dragon voice recognition software and  may include unintentional dictation errors.    Loyd Candida LULLA Aldona, PA-C 01/07/24 2338    Willo Dunnings, MD 01/07/24 2358  "

## 2024-01-07 NOTE — ED Triage Notes (Signed)
 Pt comes with c/o two weeks of neck pain when she turns her neck to the right. Pt states her head was hurting.
